# Patient Record
Sex: Male | Born: 1985 | Race: White | Hispanic: No | Smoking: Never smoker
Health system: Southern US, Community
[De-identification: ages and names within clinical notes are randomized; demographics above are authoritative.]

## PROBLEM LIST (undated history)

## (undated) DIAGNOSIS — I10 Essential (primary) hypertension: Secondary | ICD-10-CM

## (undated) DIAGNOSIS — G4733 Obstructive sleep apnea (adult) (pediatric): Secondary | ICD-10-CM

## (undated) DIAGNOSIS — D6801 Von willebrand disease, type 1: Secondary | ICD-10-CM

## (undated) DIAGNOSIS — F419 Anxiety disorder, unspecified: Secondary | ICD-10-CM

## (undated) DIAGNOSIS — R55 Syncope and collapse: Secondary | ICD-10-CM

## (undated) DIAGNOSIS — D680 Von Willebrand's disease: Secondary | ICD-10-CM

## (undated) HISTORY — DX: Essential (primary) hypertension: I10

## (undated) HISTORY — DX: Anxiety disorder, unspecified: F41.9

## (undated) HISTORY — DX: Syncope and collapse: R55

## (undated) HISTORY — DX: Von willebrand disease, type 1: D68.01

## (undated) HISTORY — DX: Obstructive sleep apnea (adult) (pediatric): G47.33

## (undated) HISTORY — DX: Von Willebrand's disease: D68.0

---
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
## 2010-06-19 ENCOUNTER — Inpatient Hospital Stay (HOSPITAL_COMMUNITY)
Admission: EM | Admit: 2010-06-19 | Discharge: 2010-06-20 | DRG: 312 | Disposition: A | Discharge status: Home / Self Care | Payer: Self-pay | Attending: Internal Medicine | Admitting: Internal Medicine

## 2010-06-19 ENCOUNTER — Emergency Department (HOSPITAL_COMMUNITY): Payer: Self-pay

## 2010-06-19 DIAGNOSIS — I10 Essential (primary) hypertension: Secondary | ICD-10-CM | POA: Diagnosis present

## 2010-06-19 DIAGNOSIS — R55 Syncope and collapse: Principal | ICD-10-CM | POA: Diagnosis present

## 2010-06-19 DIAGNOSIS — G4733 Obstructive sleep apnea (adult) (pediatric): Secondary | ICD-10-CM | POA: Diagnosis present

## 2010-06-19 LAB — CBC
Hemoglobin: 17 g/dL (ref 13.0–17.0)
MCH: 32.1 pg (ref 26.0–34.0)
MCHC: 36.9 g/dL — ABNORMAL HIGH (ref 30.0–36.0)
Platelets: 169 10*3/uL (ref 150–400)
RDW: 12 % (ref 11.5–15.5)

## 2010-06-19 LAB — BASIC METABOLIC PANEL
Calcium: 9.9 mg/dL (ref 8.4–10.5)
Creatinine, Ser: 0.97 mg/dL (ref 0.4–1.5)
GFR calc Af Amer: >60 mL/min (ref >60)
GFR calc non Af Amer: >60 mL/min (ref >60)
Sodium: 140 mEq/L (ref 135–145)

## 2010-06-20 ENCOUNTER — Inpatient Hospital Stay (HOSPITAL_COMMUNITY): Payer: Self-pay

## 2010-06-20 ENCOUNTER — Ambulatory Visit (HOSPITAL_COMMUNITY): Payer: 59 | Attending: Internal Medicine

## 2010-06-20 DIAGNOSIS — R55 Syncope and collapse: Secondary | ICD-10-CM

## 2010-06-20 LAB — GLUCOSE, CAPILLARY: Glucose-Capillary: 84 mg/dL (ref 70–99)

## 2010-07-17 ENCOUNTER — Encounter: Payer: 59 | Admitting: Internal Medicine

## 2010-07-25 ENCOUNTER — Encounter (INDEPENDENT_AMBULATORY_CARE_PROVIDER_SITE_OTHER): Payer: Self-pay | Admitting: *Deleted

## 2010-07-25 NOTE — Discharge Summary (Signed)
  NAMESTEVE, Dylan [name]             ACCOUNT NO.:  [id number]  MEDICAL RECORD NO.:  [id number]           PATIENT TYPE:  I  LOCATION:  1407                         FACILITY:  [facility]  PHYSICIAN:  Pricilla Riffle, MD, FACCDATE OF BIRTH:  November 10, 1985  DATE OF ADMISSION:  06/19/2010 DATE OF DISCHARGE:  06/20/2010                              DISCHARGE SUMMARY   PROCEDURES: 1. Graded exercise treadmill. 2. Two-view chest x-ray. 3. 2-D echocardiogram.  PRIMARY FINAL DISCHARGE DIAGNOSIS:  Syncope.  SECONDARY DIAGNOSES: 1. Hypertension. 2. Obstructive sleep apnea on continuous positive airway pressure. 3. History of a mole removal.  TIME AT DISCHARGE:  33 minutes.  HOSPITAL COURSE:  [name] is a 25 year old male with no previous cardiac issues.  He had several episodes of syncope and came to the hospital where he was admitted for further evaluation and treatment.  He had no significant abnormalities on a CBC and a BMET.  He had no significant arrhythmias on telemetry overnight.  The next day he had an exercise treadmill test and during the test reached his target heart rate and exceeded it.  He reached 100% of his predicted maximum without any symptoms.  He had good exercise tolerance, reaching stage 4.  A head CT is pending.  On June 20, 2010, Dr. [name] evaluated his echocardiogram and a head CT is pending.  If these tests are within normal limits, he is considered stable for discharge, to follow up with Neurology and Primary Care as well as Cardiology.  DISCHARGE INSTRUCTIONS:  His activity level is to be increased gradually.  He is encouraged to stick to a heart-healthy diet.  He is to follow up with his primary care physician and with Neurology, which we will arrange.  He will be contacted by Dr. [name] for followup.  DISCHARGE MEDICATIONS:  Atenolol 25 mg daily.     Theodore Demark, PA-C   ______________________________ Pricilla Riffle, MD, [facility]    RB/MEDQ  D:   06/20/2010  T:  06/20/2010  Job:  [id number]  Electronically Signed by Theodore Demark PA-C on 07/04/2010 11:58:55 AM Electronically Signed by Dietrich Pates MD FACC on 07/24/2010 02:11:42 PM

## 2010-07-25 NOTE — Consult Note (Signed)
[name], [name]             ACCOUNT NO.:  [id number]  MEDICAL RECORD NO.:  [id number]           PATIENT TYPE:  I  LOCATION:  1407                         FACILITY:  [facility]  PHYSICIAN:  Pricilla Riffle, MD, FACCDATE OF BIRTH:  [birth year]  DATE OF CONSULTATION:  06/19/2010 DATE OF DISCHARGE:                                CONSULTATION   IDENTIFICATION:  The patient is a 25 year old who was asked to see regarding syncope.  HISTORY OF PRESENT ILLNESS:  The patient has no known cardiac history. He was doing well until about 10 days ago.  Ten days [place], he became dizzy, the room started spinning, he developed a terrible headache and epigastric cramping, diaphoresis, no diarrhea, no vomiting, did not pass out but felt like he was about to, overall the episode lasted about 15 minutes and resolved and he went to bed.  No palpitations.  Three days later, he began passing out. Episodes have always occurred while he was moving "like a ton of bricks the episodes start very fast." He gets dizzy, the room spins, and then he passes out.  When he wakes up, he has a headache, epigastric cramping, he feels a little confused for a short bit and denies any incontinence of bowel or bladder.  No other neurologic symptoms that he has been told.  Episodes last about 5 minutes, and then he is fine though he feels drained the rest of the day.  Again, no prodrome with palpitations.  They have occurred every day x1.  Today he had 2 episodes  A month ago he had no problems.  He denies any prior history of dizziness with standing.  He has not been seen by a physician yet.  Not dizzy at other times.  ALLERGIES:  None.  MEDICATIONS:  Atenolol 50.  PAST MEDICAL HISTORY:  Hypertension, sleep apnea, uses CPAP.  PAST SURGICAL HISTORY:  Mole biopsied.  SOCIAL HISTORY:  The patient is single, works at [employer].  Does not drink, does not smokes.  FAMILY  HISTORY:  Mother is alive, history of hypertension, hyperlipidemia.  Father has a history of obstructive sleep apnea, hypertriglyceridemia and hypertension.  Paternal grandfather with seizures.  Paternal grandmother with CHF, breast cancer, cirrhosis. Paternal uncle died after surgery [facility]).  REVIEW OF SYSTEMS:  All systems reviewed.  Denies fevers, chills, sweats as noted, headache as noted, has had a history of chronic headaches in the past.  No wheezing.  Appetite has been good.  Weight has been stable.  He is really sick.  GU, negative.  GI, negative.  No history of diabetes.  No recent traveling.  Otherwise, all systems reviewed negative to the above problem except as noted above.  PHYSICAL EXAMINATION:  GENERAL:  On exam, the patient is in no distress at rest. VITAL SIGNS:  Blood pressure lying 130/77, pulse 77; sitting 139/91, pulse 81; standing 150/89, pulse 88; standing at 2 minutes 146/87, pulse 89.  Temperature is 98.4, respiratory rate 18. HEENT:  Normocephalic, atraumatic.  EOMI.  PERRL. NECK:  JVP is normal.  No thyromegaly or bruits.  CARDIAC:  Regular rate and rhythm, S1 and S2.  No S3, S4 or murmurs. LUNGS:  Clear to auscultation without rales or wheezes. ABDOMEN:  Supple, nontender.  No masses.  Normal bowel sounds.  No hepatomegaly. EXTREMITIES:  Good distal pulses throughout.  Equal onset.  No lower extremity edema. NEURO:  Alert and oriented x3.  Cranial nerves II-XII grossly intact. Moving all extremities.  Chest x-ray normal.  A 12-lead EKG normal sinus rhythm, 85 beats per minute.  Labs significant for hemoglobin of 17, BUN and creatinine of 12 and 0.97, potassium of 3.9.  IMPRESSION:  The patient is a 25 year old with history of hypertension, sleep apnea and now with 10-day history of recurrent syncope.  All have occurred while he was moving.  He denies dizziness prior.  No palpitations in the prodrome.  He is dizzy when walking, room  starts spinning, he passes out when he wakes, he is confused, has a headache, abdominal cramping and feels weak.  Otherwise denies dizziness.  On exam, he is not orthostatic.  Otherwise exam is unremarkable.  EKG is normal.  PLAN:  Admit to telemetry, hold atenolol and follow blood pressure, echo in the a.m., recheck orthostatics.  Walk with assistance to see if reproduces symptoms.  If negative we will get treadmill test to see if we can reproduce symptoms.  Discussed with Dylan [name].     Pricilla Riffle, MD, [facility]     PVR/MEDQ  D:  06/20/2010  T:  06/20/2010  Job:  [id number]  Electronically Signed by Dietrich Pates MD [facility] on 07/24/2010 02:11:27 PM

## 2010-07-29 NOTE — Letter (Signed)
Summary: Appointment - Missed  [facility], Main Office  [address]   [place], [place] [address]   Phone: [phone]  Fax: [phone]     July 25, 2010 MRN: [id number]   Dylan [name] [address] [place], [place]  [address]   Dear [name],  Our records indicate you missed your appointment on March 1,2012  with  Dr. Gala Romney .  It is very important that we reach you to reschedule this appointment. We look forward to participating in your health care needs. Please contact us at the number listed above at your earliest convenience to reschedule this appointment.     Sincerely,    Glass blower/designer

## 2010-09-02 ENCOUNTER — Encounter: Payer: Self-pay | Admitting: Internal Medicine

## 2011-12-03 ENCOUNTER — Encounter: Payer: Self-pay | Admitting: Internal Medicine

## 2011-12-03 ENCOUNTER — Ambulatory Visit (INDEPENDENT_AMBULATORY_CARE_PROVIDER_SITE_OTHER): Payer: 59 | Admitting: Internal Medicine

## 2011-12-03 VITALS — BP 128/84 | HR 68 | Temp 98.1°F | Wt 185.0 lb

## 2011-12-03 DIAGNOSIS — R55 Syncope and collapse: Secondary | ICD-10-CM | POA: Insufficient documentation

## 2011-12-03 DIAGNOSIS — Z0000 Encounter for general adult medical examination without abnormal findings: Secondary | ICD-10-CM | POA: Insufficient documentation

## 2011-12-03 DIAGNOSIS — Z23 Encounter for immunization: Secondary | ICD-10-CM

## 2011-12-03 DIAGNOSIS — G4733 Obstructive sleep apnea (adult) (pediatric): Secondary | ICD-10-CM

## 2011-12-03 DIAGNOSIS — I10 Essential (primary) hypertension: Secondary | ICD-10-CM | POA: Insufficient documentation

## 2011-12-03 LAB — CBC WITH DIFFERENTIAL/PLATELET
Basophils Absolute: 0 10*3/uL (ref 0.0–0.1)
Basophils Relative: 0.4 % (ref 0.0–3.0)
Eosinophils Absolute: 0 10*3/uL (ref 0.0–0.7)
Eosinophils Relative: 1.3 % (ref 0.0–5.0)
HCT: 43.4 % (ref 39.0–52.0)
Hemoglobin: 14.6 g/dL (ref 13.0–17.0)
Lymphocytes Relative: 23 % (ref 12.0–46.0)
Lymphs Abs: 0.8 10*3/uL (ref 0.7–4.0)
MCHC: 33.8 g/dL (ref 30.0–36.0)
MCV: 92.7 fl (ref 78.0–100.0)
Monocytes Absolute: 0.2 10*3/uL (ref 0.1–1.0)
Monocytes Relative: 6.5 % (ref 3.0–12.0)
Neutro Abs: 2.5 10*3/uL (ref 1.4–7.7)
Neutrophils Relative %: 68.8 % (ref 43.0–77.0)
Platelets: 164 10*3/uL (ref 150.0–400.0)
RBC: 4.68 Mil/uL (ref 4.22–5.81)
RDW: 12.3 % (ref 11.5–14.6)
WBC: 3.7 10*3/uL — ABNORMAL LOW (ref 4.5–10.5)

## 2011-12-03 LAB — LIPID PANEL
HDL: 55.3 mg/dL (ref >39.00)
Total CHOL/HDL Ratio: 3
VLDL: 15.2 mg/dL (ref 0.0–40.0)

## 2011-12-03 LAB — COMPREHENSIVE METABOLIC PANEL
ALT: 15 U/L (ref 0–53)
AST: 16 U/L (ref 0–37)
BUN: 18 mg/dL (ref 6–23)
Calcium: 9.5 mg/dL (ref 8.4–10.5)
Chloride: 103 mEq/L (ref 96–112)
Creatinine, Ser: 0.9 mg/dL (ref 0.4–1.5)
Total Bilirubin: 1 mg/dL (ref 0.3–1.2)

## 2011-12-03 LAB — TSH: TSH: 0.92 u[IU]/mL (ref 0.35–5.50)

## 2011-12-03 NOTE — Assessment & Plan Note (Signed)
Refer to pulmonary, has been untreated x a while

## 2011-12-03 NOTE — Progress Notes (Signed)
  Subjective:    Patient ID: Dylan [name], male    DOB: October 29, 1985, 26 y.o.   MRN: [id number]  HPI New pt, CPX  Past medical history H/o Hypertension, on no meds  Sleep apnea, not using a CPAP at present History of syncope, 2012  Past surgical history skin biopsy 2004  Social history Soon to be married, 1 child  tobacco-- no ETOH-- rarely  Occupation-- Occupational hygienist    Family history Diabetes-- GP HTN-- several family embers CAD-- GP Stroke-- several family members Epilepsy -- GF dementia-- GF Colon cancer-- no Breast cancer-- GM Prostate cancer-- no   Review of Systems Reports recurrent syncopes, symptoms started about late 2011, since then he had about 12 episodes approximately. Was admitted to the hospital   2 -2012, was seen by cardiology, CT head normal, had a echocardiogram (see a/p). He did not followup as an outpatient with cardiology. Episodes are described as sudden collapse, he's not sure if he gets completely unconscious, usually associated with stress, episode last ~ 1 minute. No bladder or bowel incontinence, seizure activity or tongue bite. Denies chest pain, occasionally has a headache. No nausea, vomiting, diarrhea or blood in the stools. No dysuria gross hematuria. No anxiety or depression per se .    Objective:   Physical Exam  General -- alert, well-developed, and well-nourished.   Neck --no thyromegaly , normal carotid pulse Lungs -- normal respiratory effort, no intercostal retractions, no accessory muscle use, and normal breath sounds.   Heart-- normal rate, regular rhythm, no murmur, and no gallop.   Abdomen--soft, non-tender, no distention, no masses, no HSM, no guarding, and no rigidity.   Extremities-- no pretibial edema bilaterally Neurologic-- alert & oriented X3 and strength normal in all extremities. Psych-- Cognition and judgment appear intact. Alert and cooperative with normal attention span and concentration.  not anxious  appearing and not depressed appearing.       Assessment & Plan:

## 2011-12-03 NOTE — Patient Instructions (Addendum)
Check the  blood pressure 2 or 3 times a week, be sure it is between 110/60 and 140/85. If it is consistently higher or lower, let me know  

## 2011-12-03 NOTE — Assessment & Plan Note (Signed)
Tdap today Diet and exercise discussed Labs Self testicular exam

## 2011-12-03 NOTE — Assessment & Plan Note (Addendum)
Recurrent syncope, no associated symptoms, specifically no associated symptoms consistent with seizures. Episodes are triggered by emotional distress. ECHO 06-2010--decreased left ventricular,   diastolic compliance and/or increased left atrial pressure DDX: Arrhythmia,  anxiety,  seizures, others. Plan: EKG today, nsr  Refer to cardiology

## 2011-12-03 NOTE — Assessment & Plan Note (Signed)
At some point he took atenolol, BP corrected after  sleep apnea treatment. Currently on no medications on BP normal despite the fact that he's not using a CPAP

## 2011-12-07 ENCOUNTER — Encounter: Payer: Self-pay | Admitting: *Deleted

## 2011-12-31 ENCOUNTER — Institutional Professional Consult (permissible substitution): Payer: 59 | Admitting: Internal Medicine

## 2012-01-07 ENCOUNTER — Institutional Professional Consult (permissible substitution): Payer: 59 | Admitting: Pulmonary Disease

## 2012-01-28 ENCOUNTER — Institutional Professional Consult (permissible substitution): Payer: 59 | Admitting: Pulmonary Disease

## 2012-01-28 ENCOUNTER — Institutional Professional Consult (permissible substitution): Payer: 59 | Admitting: Cardiology

## 2012-03-10 ENCOUNTER — Telehealth: Payer: Self-pay | Admitting: Internal Medicine

## 2012-03-10 NOTE — Telephone Encounter (Signed)
Patient would like to know if he is due for any immunizations. He has flu shot scheduled for 03/15/12 @ 4:15p. He is expecting a child soon and wants to make sure he does not need anything.

## 2012-03-11 NOTE — Telephone Encounter (Signed)
According to our records the pt is up to date with Tdap. Please advise.

## 2012-03-15 ENCOUNTER — Ambulatory Visit: Payer: 59

## 2012-03-18 ENCOUNTER — Ambulatory Visit (INDEPENDENT_AMBULATORY_CARE_PROVIDER_SITE_OTHER): Payer: 59 | Admitting: Internal Medicine

## 2012-03-18 ENCOUNTER — Encounter: Payer: Self-pay | Admitting: Internal Medicine

## 2012-03-18 VITALS — BP 138/82 | HR 80 | Temp 98.3°F | Wt 190.0 lb

## 2012-03-18 DIAGNOSIS — R238 Other skin changes: Secondary | ICD-10-CM

## 2012-03-18 DIAGNOSIS — J329 Chronic sinusitis, unspecified: Secondary | ICD-10-CM

## 2012-03-18 MED ORDER — AMOXICILLIN 500 MG PO CAPS: 1000.0000 mg | ORAL_CAPSULE | Freq: Two times a day (BID) | ORAL | Status: DC

## 2012-03-18 NOTE — Progress Notes (Signed)
  Subjective:    Patient ID: Dylan [name], male    DOB: 12-27-1985, 26 y.o.   MRN: [id number]  HPI Acute visit, we discussed the following:  History of easy bruising all his life, last week developed 2 bruises-- one on the left shoulder, one in the left leg, they are  better. Denies any injury, not taking aspirin. Also 3 or 4 days history of cough, frontal congestion, frontal headache, occasional green nasal discharge.   Past medical history H/o Hypertension, on no meds   Sleep apnea, not using a CPAP at present History of syncope, 2012  Past surgical history skin biopsy 2004  Social history Soon to be married, 1 child   tobacco-- no ETOH-- rarely   Occupation-- Occupational hygienist    Family history Diabetes-- GP HTN-- several family embers CAD-- GP Stroke-- several family members Epilepsy -- GF dementia-- GF Colon cancer-- no Breast cancer-- GM Prostate cancer-- no  Review of Systems Denies any fever or chills No chest congestion, no history of asthma wheezing. Denies any blood in the stools, blood in the urine , no excessive gum bleeding No history of major surgeries.     Objective:   Physical Exam General -- alert, well-developed, and well-nourished.   HEENT -- TMs bulge B, no red or d/c; throat w/o redness, face symmetric and tender to palpation @ frontal sinuses but not at maxillary area  Lungs -- normal respiratory effort, no intercostal retractions, no accessory muscle use, and normal breath sounds.   Heart-- normal rate, regular rhythm, no murmur, and no gallop.   Extremities-- green colored bruise @ the L deltoid area and L thigh Neurologic-- alert & oriented X3 and strength normal in all extremities. Psych-- Cognition and judgment appear intact. Alert and cooperative with normal attention span and concentration.  not anxious appearing and not depressed appearing.       Assessment & Plan:   Sinusitis, see instruction  Long history of easy bruising,  review of systems is negative for  serious bleeding. Last CBC show normal platelets Plan: Observation, will consider PT, PTT on return to the office  As far as his immunizations, he is up-to-date on the tetanus shot, recommend to get a flu shot once he is better from sinusitis.

## 2012-03-18 NOTE — Telephone Encounter (Signed)
See OV note.  

## 2012-03-18 NOTE — Patient Instructions (Addendum)
Rest, fluids , tylenol For cough, take Mucinex DM twice a day as needed  For congestion use dymista 2 sprays on each side of the nose until sample gone Take the antibiotic as prescribed  (Amoxicillin) Call if no better in few days Call anytime if the symptoms are severe ---- Flu shot once better --- Call if easy bruising gets worse

## 2012-04-07 ENCOUNTER — Telehealth: Payer: Self-pay | Admitting: Internal Medicine

## 2012-04-07 NOTE — Telephone Encounter (Signed)
lmovm for pt to return call.  

## 2012-04-07 NOTE — Telephone Encounter (Signed)
Please advise 

## 2012-04-07 NOTE — Telephone Encounter (Signed)
[facility] Triage Call Report Triage Record Num: [id number] Operator: Jeraldine Loots Patient Name: Dylan [name] Call Date & Time: 04/06/2012 5:14:46PM Patient Phone: [phone] PCP: Nolon Rod. Paz Patient Gender: Male PCP Fax : Patient DOB: 02/16/1986 Practice Name: [facility] Reason for Call: Patient calling, has had blood in his semen for the past 2 nights. Denies any pain or injury; no swelling or tenderness. No fever. Had a recent sinus infection and completed medication for same. Triaged per Scrotum or Testicle issues. He is to call if any sx worsens or he develops any fever or pain. Protocol(s) Used: Scrotum or Testicles Symptoms Recommended Outcome per Protocol: Provide Home/Self Care Reason for Outcome: All other situations Care Advice: ~

## 2012-04-07 NOTE — Telephone Encounter (Signed)
Needs OV within few days

## 2012-04-08 NOTE — Telephone Encounter (Signed)
Discussed with pt's wife. She states she will discuss with the pt & call back to schedule an appt next week.

## 2012-04-11 ENCOUNTER — Encounter: Payer: Self-pay | Admitting: Internal Medicine

## 2012-04-11 ENCOUNTER — Ambulatory Visit (INDEPENDENT_AMBULATORY_CARE_PROVIDER_SITE_OTHER): Payer: 59 | Admitting: Internal Medicine

## 2012-04-11 VITALS — BP 138/86 | HR 69 | Temp 98.2°F | Wt 192.0 lb

## 2012-04-11 DIAGNOSIS — R361 Hematospermia: Secondary | ICD-10-CM | POA: Insufficient documentation

## 2012-04-11 LAB — POCT URINALYSIS DIPSTICK: Protein, UA: NEGATIVE

## 2012-04-11 NOTE — Assessment & Plan Note (Signed)
History recent hematospermia with a normal exam. Similar episode last year. Plan: UA, urine culture, G&C. If negative will recommend observation. If you continue having episodes of hematospermia, will refer to urology

## 2012-04-11 NOTE — Progress Notes (Signed)
  Subjective:    Patient ID: Dylan [name], male    DOB: [birth year], 26 y.o.   MRN: [id number]  HPI  Acute visit, here with his wife Last week, in 2 consecutive days, saw blood in the semen, described as pink in color. No further episodes since. He recalls that something similar happen last year.  Past medical history H/o Hypertension, on no meds   Sleep apnea, not using a CPAP at present History of syncope, 2012  Past surgical history skin biopsy 2004  Social history Soon to be married, 1 child   tobacco-- no ETOH-- rarely   Occupation-- Occupational hygienist    Review of Systems Denies any pain with orgasm. No fever or chills No difficulty urinating, dysuria, gross hematuria.    Objective:   Physical Exam General -- alert, well-developed, and well-nourished.    Rectal-- No external abnormalities noted. Normal sphincter tone. No rectal masses or tenderness. Brown stool  Prostate:  Prostate gland firm and smooth, no enlargement, nodularity, tenderness, mass, asymmetry or induration. No inguinal adenopathy or hernia Neurologic-- alert & oriented X3 and strength normal in all extremities. Psych-- Cognition and judgment appear intact. Alert and cooperative with normal attention span and concentration.  not anxious appearing and not depressed appearing.        Assessment & Plan:

## 2012-04-11 NOTE — Patient Instructions (Addendum)
Please call if this happens again, you will be referred to urology

## 2012-04-12 LAB — URINALYSIS, MICROSCOPIC ONLY: Squamous Epithelial / LPF: NONE SEEN

## 2012-04-12 LAB — URINE CULTURE
Colony Count: NO GROWTH
Organism ID, Bacteria: NO GROWTH

## 2012-04-12 LAB — GC/CHLAMYDIA PROBE AMP, URINE
Chlamydia, Swab/Urine, PCR: NEGATIVE
GC Probe Amp, Urine: NEGATIVE

## 2012-04-13 ENCOUNTER — Encounter: Payer: Self-pay | Admitting: *Deleted

## 2012-04-19 ENCOUNTER — Telehealth: Payer: Self-pay | Admitting: *Deleted

## 2012-04-19 DIAGNOSIS — R361 Hematospermia: Secondary | ICD-10-CM

## 2012-04-19 NOTE — Telephone Encounter (Signed)
Pt's fiance called stating that the pt is continuing to have blood in his semen & would like to know what he needs to do. Please advise.

## 2012-04-19 NOTE — Telephone Encounter (Signed)
Arrange a urology referral -- dx  hematospermia Please call the patient and let him know

## 2012-04-20 NOTE — Telephone Encounter (Signed)
Left detailed msg on vmail. Referral entered.

## 2012-06-09 ENCOUNTER — Ambulatory Visit: Payer: 59 | Admitting: Internal Medicine

## 2012-06-10 ENCOUNTER — Encounter: Payer: Self-pay | Admitting: Internal Medicine

## 2012-06-10 ENCOUNTER — Ambulatory Visit (INDEPENDENT_AMBULATORY_CARE_PROVIDER_SITE_OTHER): Payer: 59 | Admitting: Internal Medicine

## 2012-06-10 VITALS — BP 128/84 | HR 74 | Temp 98.0°F | Wt 194.0 lb

## 2012-06-10 DIAGNOSIS — F411 Generalized anxiety disorder: Secondary | ICD-10-CM

## 2012-06-10 DIAGNOSIS — R361 Hematospermia: Secondary | ICD-10-CM

## 2012-06-10 DIAGNOSIS — D680 Von Willebrand's disease: Secondary | ICD-10-CM | POA: Insufficient documentation

## 2012-06-10 DIAGNOSIS — R55 Syncope and collapse: Secondary | ICD-10-CM

## 2012-06-10 DIAGNOSIS — R791 Abnormal coagulation profile: Secondary | ICD-10-CM

## 2012-06-10 DIAGNOSIS — G4733 Obstructive sleep apnea (adult) (pediatric): Secondary | ICD-10-CM

## 2012-06-10 DIAGNOSIS — R238 Other skin changes: Secondary | ICD-10-CM

## 2012-06-10 DIAGNOSIS — F419 Anxiety disorder, unspecified: Secondary | ICD-10-CM

## 2012-06-10 LAB — PROTIME-INR
INR: 1.2 ratio — ABNORMAL HIGH (ref 0.8–1.0)
Prothrombin Time: 12.4 s (ref 10.2–12.4)

## 2012-06-10 MED ORDER — ESCITALOPRAM OXALATE 10 MG PO TABS: 10.0000 mg | ORAL_TABLET | Freq: Every day | ORAL | Status: DC

## 2012-06-10 NOTE — Assessment & Plan Note (Signed)
Saw urology 05/27/2012, no need for further workup of this point.

## 2012-06-10 NOTE — Patient Instructions (Addendum)
Come back in 6 weeks, call if you have  side effects from escitalopram Is important that you see the heart doctor and pulmonologist for the treatment of sleep apnea. If you need help scheduling those appointments let us know

## 2012-06-10 NOTE — Assessment & Plan Note (Addendum)
Ongoing symptoms, has put off seeing a cardiologist due to a lack of time. I explained the patient is extremely important that he gets evaluated, risk of accidents, hurting himself or others discussed. He has noted that syncope is consistently related to times of high stress, we are starting treatment with Lexapro today.

## 2012-06-10 NOTE — Assessment & Plan Note (Addendum)
Pt has mentioned this issue before, has hematospermia about denies major bleeding such as blood in the stool or in the urine. We'll go ahead and check a PT and PTT, if  labs normal, recommend observation . Recent  CBC normal

## 2012-06-10 NOTE — Assessment & Plan Note (Addendum)
The patient does have some anxiety, mostly related to work. I think he needs treatment, we talked about counseling and medication. He has never tried SSRIs. I think he is a good candidate for lexapro. Plan: Trial with Lexapro, recheck in 6 weeks, side effects discussed, to call if develops suicidal ideas.

## 2012-06-10 NOTE — Progress Notes (Signed)
  Subjective:    Patient ID: Dylan [name], male    DOB: 12-07-1985, 27 y.o.   MRN: [id number]  HPI Six-month followup, we discussed the following issues  Continue with easy bruising. Denies blood in the stools or in the urine, no excessive bleeding when he brushes his teeth. He did have some hematospermia, saw urology, was recommended no further workup.  Syncope, had 3 or 4 additional episodes, the only thing that he can tell is that is usually related to times of high stress. Has not seen cardiology; reports that he is very busy and didn't have time.  Sleep apnea, still has not find time to see the pulmonary doctor.  On further questioning, I asked him about anxiety, he admits that he has a lot of stress, mostly job-related but also has a 40.27 month old baby at home. He admits to feeling anxious most days, denies depression.   Past Medical History  Diagnosis Date  . Hypertension   . OSA (obstructive sleep apnea)   . Anxiety   . Syncope    Past Surgical History  Procedure Date  . Skin biopsy 2004    Social history  Soon to be married, 1 child  tobacco-- no  ETOH-- rarely  Occupation-- Occupational hygienist    Review of Systems See HPI    Objective:   Physical Exam General -- alert, well-developed, and well-nourished.   Skin -- single small (2cm)  bruise noted in the right chest, greenish in color Neurologic-- alert & oriented X3 and strength normal in all extremities. Psych-- Cognition and judgment appear intact. Alert and cooperative with normal attention span and concentration.  Mild-mod anxious appearing ; not depressed appearing.       Assessment & Plan:  Today , I spent more than 25 min with the patient, >50% of the time counseling

## 2012-06-10 NOTE — Assessment & Plan Note (Signed)
Has not seen pulmonary, I again emphasized the need to treat sleep apnea!

## 2012-06-13 ENCOUNTER — Encounter: Payer: Self-pay | Admitting: *Deleted

## 2012-06-13 NOTE — Addendum Note (Signed)
Addended by: Edwena Felty T on: 06/13/2012 05:10 PM   Modules accepted: Orders

## 2012-06-15 ENCOUNTER — Telehealth: Payer: Self-pay | Admitting: Internal Medicine

## 2012-06-15 ENCOUNTER — Telehealth: Payer: Self-pay

## 2012-06-15 ENCOUNTER — Telehealth: Payer: Self-pay | Admitting: Hematology & Oncology

## 2012-06-15 NOTE — Telephone Encounter (Signed)
PT MADE 2-7 APPOINTMENT

## 2012-06-15 NOTE — Telephone Encounter (Signed)
Spoke w/pt fiance. Updated ph #.

## 2012-06-15 NOTE — Telephone Encounter (Signed)
Spoke with pt's fiance. Appt scheduled with Dr. Myna Hidalgo for 2.7.14.

## 2012-06-15 NOTE — Telephone Encounter (Signed)
Caller from Dr.Ennever's office called to give a status update on referral, they have tried to contact patient and number on file is disconnected. Left message on VM for patient's emergency contact (Fiance-see demographics) (FYI)

## 2012-06-15 NOTE — Telephone Encounter (Signed)
Patient's fiance, Baxter Hire, calling to go over patient's recent lab results.  She and the patient want more clarification about what these labs were, and why they may have been so abnormal.

## 2012-06-15 NOTE — Telephone Encounter (Signed)
Discussed with pt's fiance.

## 2012-06-22 ENCOUNTER — Emergency Department (HOSPITAL_BASED_OUTPATIENT_CLINIC_OR_DEPARTMENT_OTHER)
Admission: EM | Admit: 2012-06-22 | Discharge: 2012-06-22 | Disposition: A | Discharge status: Home / Self Care | Payer: 59 | Attending: Emergency Medicine | Admitting: Emergency Medicine

## 2012-06-22 ENCOUNTER — Encounter (HOSPITAL_BASED_OUTPATIENT_CLINIC_OR_DEPARTMENT_OTHER): Payer: Self-pay | Admitting: *Deleted

## 2012-06-22 DIAGNOSIS — I10 Essential (primary) hypertension: Secondary | ICD-10-CM | POA: Insufficient documentation

## 2012-06-22 DIAGNOSIS — IMO0002 Reserved for concepts with insufficient information to code with codable children: Secondary | ICD-10-CM | POA: Insufficient documentation

## 2012-06-22 DIAGNOSIS — G4733 Obstructive sleep apnea (adult) (pediatric): Secondary | ICD-10-CM | POA: Insufficient documentation

## 2012-06-22 DIAGNOSIS — Z79899 Other long term (current) drug therapy: Secondary | ICD-10-CM | POA: Insufficient documentation

## 2012-06-22 DIAGNOSIS — F411 Generalized anxiety disorder: Secondary | ICD-10-CM | POA: Insufficient documentation

## 2012-06-22 DIAGNOSIS — Y9289 Other specified places as the place of occurrence of the external cause: Secondary | ICD-10-CM | POA: Insufficient documentation

## 2012-06-22 DIAGNOSIS — Y9389 Activity, other specified: Secondary | ICD-10-CM | POA: Insufficient documentation

## 2012-06-22 DIAGNOSIS — T148XXA Other injury of unspecified body region, initial encounter: Secondary | ICD-10-CM

## 2012-06-22 MED ORDER — METHOCARBAMOL 500 MG PO TABS: 500.0000 mg | ORAL_TABLET | Freq: Two times a day (BID) | ORAL | Status: DC

## 2012-06-22 MED ORDER — IBUPROFEN 600 MG PO TABS: 600.0000 mg | ORAL_TABLET | Freq: Four times a day (QID) | ORAL | Status: DC | PRN
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
## 2012-06-24 ENCOUNTER — Ambulatory Visit: Payer: 59

## 2012-06-24 ENCOUNTER — Ambulatory Visit (HOSPITAL_BASED_OUTPATIENT_CLINIC_OR_DEPARTMENT_OTHER)
Admission: RE | Admit: 2012-06-24 | Discharge: 2012-06-24 | Disposition: A | Discharge status: Home / Self Care | Payer: 59 | Source: Ambulatory Visit | Attending: Hematology & Oncology | Admitting: Hematology & Oncology

## 2012-06-24 ENCOUNTER — Other Ambulatory Visit: Payer: Self-pay | Admitting: Oncology

## 2012-06-24 ENCOUNTER — Ambulatory Visit (HOSPITAL_BASED_OUTPATIENT_CLINIC_OR_DEPARTMENT_OTHER): Payer: 59 | Admitting: Hematology & Oncology

## 2012-06-24 ENCOUNTER — Other Ambulatory Visit (HOSPITAL_BASED_OUTPATIENT_CLINIC_OR_DEPARTMENT_OTHER): Payer: 59 | Admitting: Lab

## 2012-06-24 VITALS — BP 133/81 | HR 98 | Temp 98.1°F | Resp 18 | Ht 74.0 in | Wt 194.0 lb

## 2012-06-24 DIAGNOSIS — R042 Hemoptysis: Secondary | ICD-10-CM

## 2012-06-24 DIAGNOSIS — D510 Vitamin B12 deficiency anemia due to intrinsic factor deficiency: Secondary | ICD-10-CM

## 2012-06-24 DIAGNOSIS — T148XXA Other injury of unspecified body region, initial encounter: Secondary | ICD-10-CM

## 2012-06-24 DIAGNOSIS — R42 Dizziness and giddiness: Secondary | ICD-10-CM | POA: Insufficient documentation

## 2012-06-24 DIAGNOSIS — R791 Abnormal coagulation profile: Secondary | ICD-10-CM

## 2012-06-24 DIAGNOSIS — R55 Syncope and collapse: Secondary | ICD-10-CM | POA: Insufficient documentation

## 2012-06-24 LAB — CBC WITH DIFFERENTIAL (CANCER CENTER ONLY)
BASO#: 0 10*3/uL (ref 0.0–0.2)
EOS%: 1.5 % (ref 0.0–7.0)
Eosinophils Absolute: 0.1 10*3/uL (ref 0.0–0.5)
HGB: 15.8 g/dL (ref 13.0–17.1)
LYMPH#: 1.2 10*3/uL (ref 0.9–3.3)
NEUT#: 2.4 10*3/uL (ref 1.5–6.5)
Platelets: 185 10*3/uL (ref 145–400)
RBC: 5.04 10*6/uL (ref 4.20–5.70)

## 2012-06-24 LAB — CHCC SATELLITE - SMEAR

## 2012-06-24 NOTE — Progress Notes (Signed)
This office note has been dictated.

## 2012-06-25 NOTE — Progress Notes (Signed)
CC:   Dylan Ora, MD  DIAGNOSES: 1. Easy bruising. 2. Elevated partial thromboplastin time.  HISTORY OF PRESENT ILLNESS:  Dylan [name] is a real nice 27 year old white gentleman.  He works down at [employer] at, I think, [employer] [place].  He comes in with his wife and his new baby who is about 85 or 71 months old.  He sees Dr. Willow [name].  He had been complaining of some problems with bruising.  He has had no bleeding.  He has no nausea or vomiting.  He does have some issues with his sperm, but he sees Dr. Isabel Caprice of Urology for this.  He did have an episode of hemoptysis I think a couple weeks or so ago. This was just 1 time.  He does not smoke.  He also has had some syncopal episodes.  It is hard to say what triggers these.  He has not had any x-rays or cardiac issue studies for about 2 years so.  He also has sleep apnea.  He is not a big guy.  He does have the apparatus set up which he does not like to use.  Again, we are asked to see him because of this issue with some bruising.  He had a PTT done by Dr. Drue Novel on January 24th.  His PTT was 39.2 seconds.  He had a prothrombin time which was normal at 12.4 seconds with an INR of 1.2.  Again, Dylan [name] is feeling okay.  Again, he has not had any obvious bleeding.  He has not had any blood with his urine.  There has been no bleeding with the bowels.  He has not had any type of tooth extractions.  He has had no surgeries. He did have a mole removed from his left anterior chest wall.  There was no bleeding associated with this.  Again, we are asked to see him because of this bruising and elevated PTT.  PAST MEDICAL HISTORY: 1. Hypertension. 2. Obstructive sleep apnea. 3. Anxiety. 4. Syncope. 5. Hematospermia.  ALLERGIES:  None.  MEDICATIONS:  Ibuprofen 600 mg p.o. q.6 hours p.r.n.  SOCIAL HISTORY:  Negative for tobacco use.  He has rare alcohol use.  He has 2 children.  FAMILY HISTORY:  Negative for any bleeding or  bruising.  REVIEW OF SYSTEMS:  As stated in the history of present illness.  No additional findings noted on a 12-system review.  Again, he has had these syncopal episodes.  He did have the bout of hemoptysis.  PHYSICAL EXAMINATION:  General:  This is a well-developed, well- nourished white gentleman in no obvious distress.  Vital signs: Temperature of 98.1, pulse 68, respiratory rate 18, blood pressure 133/81.  Weight is 194.  Head and neck:  Normocephalic, atraumatic skull.  There are no ocular or oral lesions.  There are no palpable cervical or supraclavicular lymph nodes.  Lungs:  Clear to percussion and auscultation bilaterally.  There are no rales, wheezes, or rhonchi. Cardiac:  Regular rate and rhythm with a normal S1 and S2.  There are no murmurs, rubs. Or bruits.  Abdomen:  Soft with good bowel sounds.  There is no palpable abdominal mass.  There is no fluid wave.  There is no palpable hepatosplenomegaly.  Back:  No tenderness over the spine, ribs, or hips.  Extremities:  No clubbing, cyanosis, or edema.  He has good range of motion of his joints.  Skin:  No ecchymoses.  Neurological:  No focal neurological deficits.  LABORATORY STUDIES:  White cell count 4, hemoglobin 15.8, hematocrit 43, platelet count 185.  Chest x-ray shows normal cardiac silhouette.  There are no pulmonary parenchymal nodules.  IMPRESSION:  Dylan [name] is a 27 year old gentleman with an with elevated PTT.  The PTT is minimally elevated.  He has some excessive bruising.  I just cannot find anything to-date that would suggest an underlying coagulopathy.  I would be shocked if he has anything that would lead to increased bruising.  We are checking him for von Willebrand disease.  Given that he has an elevated PTT, I will send off a lupus anticoagulant panel on him and also anticardiolipin antibodies.  I just cannot imagine that Dylan [name] would have anything going on hematologically.  He does  take ibuprofen.  This could be the source for the increased bruising.  I am just going to follow Dylan [name] right now.  I will plan to get him back in another few months.  I will then see how things are going for him.    ______________________________ Josph Macho, M.D. PRE/MEDQ  D:  06/24/2012  T:  06/25/2012  Job:  [id number]

## 2012-06-28 LAB — LUPUS ANTICOAGULANT PANEL
DRVVT 1:1 Mix: 38.7 secs (ref <42.9)
Lupus Anticoagulant: NOT DETECTED

## 2012-06-30 LAB — PROTHROMBIN TIME: Prothrombin Time: 14 seconds (ref 11.6–15.2)

## 2012-06-30 LAB — APTT: aPTT: 41 seconds — ABNORMAL HIGH (ref 24–37)

## 2012-06-30 LAB — VON WILLEBRAND PANEL
Coagulation Factor VIII: 59 % — ABNORMAL LOW (ref 73–140)
Ristocetin Co-factor, Plasma: 36 % — ABNORMAL LOW (ref 42–200)

## 2012-07-01 ENCOUNTER — Institutional Professional Consult (permissible substitution): Payer: 59 | Admitting: Cardiovascular Disease

## 2012-07-06 ENCOUNTER — Telehealth: Payer: Self-pay | Admitting: Hematology & Oncology

## 2012-07-06 NOTE — Telephone Encounter (Signed)
Pt called said we had billed the wrong insurance for his visit. I told him he would need to call the number on the bill or I could see if Wille Glaser could explain it to him I offered to take a message and have Wille Glaser to call him in the morning. He said he would call back. He also wanted to speak to the MD about test results, I transferred call to Amy RN

## 2012-07-06 NOTE — Telephone Encounter (Signed)
I called and spoke with Dylan [name] and told him that I thought that he had Type 1 von Willebrand disease.  There is nothing to do for this except if he needs surgery. I told him NOT to take ASA and to limit taking Motrin/Alleve.  We do not need to see him again unless he needs surgery or begins to have bleeding issues.  Vit B12 is ok.  Pete E.

## 2012-07-22 ENCOUNTER — Ambulatory Visit: Payer: 59 | Admitting: Internal Medicine

## 2012-07-26 ENCOUNTER — Encounter: Payer: Self-pay | Admitting: Cardiovascular Disease

## 2012-08-11 ENCOUNTER — Telehealth: Payer: Self-pay | Admitting: Internal Medicine

## 2012-08-11 NOTE — Telephone Encounter (Signed)
Please advise 

## 2012-08-11 NOTE — Telephone Encounter (Signed)
Patient called to check status of wellness form. He must have this submitted by 08/15/12.

## 2012-08-12 NOTE — Telephone Encounter (Signed)
Pt made aware form is at front desk ready to be picked up.

## 2012-08-12 NOTE — Telephone Encounter (Signed)
done

## 2012-09-13 ENCOUNTER — Encounter (HOSPITAL_BASED_OUTPATIENT_CLINIC_OR_DEPARTMENT_OTHER): Payer: Self-pay | Admitting: *Deleted

## 2012-09-13 ENCOUNTER — Emergency Department (HOSPITAL_BASED_OUTPATIENT_CLINIC_OR_DEPARTMENT_OTHER): Payer: No Typology Code available for payment source

## 2012-09-13 ENCOUNTER — Emergency Department (HOSPITAL_BASED_OUTPATIENT_CLINIC_OR_DEPARTMENT_OTHER)
Admission: EM | Admit: 2012-09-13 | Discharge: 2012-09-13 | Disposition: A | Discharge status: Home / Self Care | Payer: No Typology Code available for payment source | Attending: Emergency Medicine | Admitting: Emergency Medicine

## 2012-09-13 DIAGNOSIS — Y9389 Activity, other specified: Secondary | ICD-10-CM | POA: Insufficient documentation

## 2012-09-13 DIAGNOSIS — Z8669 Personal history of other diseases of the nervous system and sense organs: Secondary | ICD-10-CM | POA: Insufficient documentation

## 2012-09-13 DIAGNOSIS — I10 Essential (primary) hypertension: Secondary | ICD-10-CM | POA: Insufficient documentation

## 2012-09-13 DIAGNOSIS — Z8659 Personal history of other mental and behavioral disorders: Secondary | ICD-10-CM | POA: Insufficient documentation

## 2012-09-13 DIAGNOSIS — Y92410 Unspecified street and highway as the place of occurrence of the external cause: Secondary | ICD-10-CM | POA: Insufficient documentation

## 2012-09-13 DIAGNOSIS — M545 Low back pain: Secondary | ICD-10-CM

## 2012-09-13 DIAGNOSIS — IMO0002 Reserved for concepts with insufficient information to code with codable children: Secondary | ICD-10-CM | POA: Insufficient documentation

## 2012-09-13 MED ORDER — CYCLOBENZAPRINE HCL 10 MG PO TABS: 10.0000 mg | ORAL_TABLET | Freq: Two times a day (BID) | ORAL | Status: DC | PRN
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
## 2012-09-13 NOTE — ED Notes (Signed)
rx x 1 given for flexeril- d/c home with family

## 2012-09-13 NOTE — ED Provider Notes (Signed)
Medical screening examination/treatment/procedure(s) were performed by non-physician practitioner and as supervising physician I was immediately available for consultation/collaboration.   Joya Gaskins, MD 09/13/12 2126

## 2012-09-14 ENCOUNTER — Encounter: Payer: Self-pay | Admitting: *Deleted

## 2012-09-14 ENCOUNTER — Encounter: Payer: Self-pay | Admitting: Cardiology

## 2012-09-15 ENCOUNTER — Ambulatory Visit: Payer: 59 | Admitting: Cardiovascular Disease

## 2012-10-13 ENCOUNTER — Ambulatory Visit: Payer: 59 | Admitting: Cardiovascular Disease

## 2013-04-20 ENCOUNTER — Telehealth: Payer: Self-pay | Admitting: *Deleted

## 2013-04-20 NOTE — Telephone Encounter (Signed)
Left message on voicemail to return my call.  

## 2013-04-21 ENCOUNTER — Encounter: Payer: Self-pay | Admitting: Internal Medicine

## 2013-04-21 ENCOUNTER — Ambulatory Visit (INDEPENDENT_AMBULATORY_CARE_PROVIDER_SITE_OTHER): Payer: 59 | Admitting: Internal Medicine

## 2013-04-21 VITALS — BP 111/72 | HR 71 | Temp 97.6°F | Ht 73.4 in | Wt 183.0 lb

## 2013-04-21 DIAGNOSIS — Z0000 Encounter for general adult medical examination without abnormal findings: Secondary | ICD-10-CM

## 2013-04-21 DIAGNOSIS — I10 Essential (primary) hypertension: Secondary | ICD-10-CM

## 2013-04-21 DIAGNOSIS — Z23 Encounter for immunization: Secondary | ICD-10-CM

## 2013-04-21 DIAGNOSIS — G4733 Obstructive sleep apnea (adult) (pediatric): Secondary | ICD-10-CM

## 2013-04-21 DIAGNOSIS — F419 Anxiety disorder, unspecified: Secondary | ICD-10-CM

## 2013-04-21 DIAGNOSIS — R55 Syncope and collapse: Secondary | ICD-10-CM

## 2013-04-21 LAB — COMPREHENSIVE METABOLIC PANEL
Albumin: 4.6 g/dL (ref 3.5–5.2)
Alkaline Phosphatase: 80 U/L (ref 39–117)
BUN: 18 mg/dL (ref 6–23)
CO2: 28 mEq/L (ref 19–32)
Calcium: 9.7 mg/dL (ref 8.4–10.5)
Chloride: 101 mEq/L (ref 96–112)
Glucose, Bld: 83 mg/dL (ref 70–99)
Potassium: 3.8 mEq/L (ref 3.5–5.1)
Sodium: 138 mEq/L (ref 135–145)
Total Protein: 7.8 g/dL (ref 6.0–8.3)

## 2013-04-21 LAB — CBC WITH DIFFERENTIAL/PLATELET
Basophils Relative: 0.5 % (ref 0.0–3.0)
Eosinophils Relative: 0.5 % (ref 0.0–5.0)
Lymphocytes Relative: 17.2 % (ref 12.0–46.0)
Monocytes Absolute: 0.4 10*3/uL (ref 0.1–1.0)
Monocytes Relative: 7.3 % (ref 3.0–12.0)
Neutrophils Relative %: 74.5 % (ref 43.0–77.0)
Platelets: 218 10*3/uL (ref 150.0–400.0)
RBC: 4.77 Mil/uL (ref 4.22–5.81)
WBC: 4.9 10*3/uL (ref 4.5–10.5)

## 2013-04-21 LAB — TSH: TSH: 1 u[IU]/mL (ref 0.35–5.50)

## 2013-04-21 NOTE — Assessment & Plan Note (Signed)
Uses a Cpap some nights, did not see pulmonary

## 2013-04-21 NOTE — Progress Notes (Signed)
   Subjective:    Patient ID: Dylan [name], male    DOB: 1986/05/06, 27 y.o.   MRN: [id number]  HPI CPX   Past Medical History  Diagnosis Date  . Hypertension   . OSA (obstructive sleep apnea)   . Anxiety   . Syncope   . Type 1 von Willebrand disease    Past Surgical History  Procedure Laterality Date  . Skin biopsy  2004   History   Social History  . Marital Status: Single    Spouse Name: N/A    Number of Children: 2  . Years of Education: N/A   Occupational History  . Occupational hygienist    Social History Main Topics  . Smoking status: Never Smoker   . Smokeless tobacco: Never Used  . Alcohol Use: Yes     Comment: rarely   . Drug Use: No  . Sexual Activity: No   Other Topics Concern  . Not on file   Social History Narrative   Married , 2 kids    Family History  Problem Relation Age of Onset  . Colon cancer Neg Hx   . Prostate cancer Neg Hx   . Diabetes Other     GM, dx early   . CAD Other     uncle at age 85  . Stroke Mother     M, GF, uncle      Review of Systems Diet-- healthy Exercise-- active at work No  CP, SOB, lower extremity edema Denies  nausea, vomiting diarrhea Denies  blood in the stools (-) cough, sputum production, (-) wheezing, chest congestion (except for a URI ~ 2 weeks ago) No dysuria, gross hematuria, difficulty urinating   No anxiety, depression     Objective:   Physical Exam BP 111/72  Pulse 71  Temp(Src) 97.6 F (36.4 C)  Ht 6' 1.4" (1.864 m)  Wt 183 lb (83.008 kg)  BMI 23.89 kg/m2  SpO2 100%  General -- alert, well-developed, NAD.  Neck --no thyromegaly Lungs -- normal respiratory effort, no intercostal retractions, no accessory muscle use, and normal breath sounds.  Heart-- normal rate, regular rhythm, no murmur.  Abdomen-- Not distended, good bowel sounds,soft, non-tender. Extremities-- no pretibial edema bilaterally  Neurologic--  alert & oriented X3. Speech normal, gait normal, strength normal in all  extremities.  Psych-- Cognition and judgment appear intact. Cooperative with normal attention span and concentration. No anxious appearing , no depressed appearing.      Assessment & Plan:

## 2013-04-21 NOTE — Patient Instructions (Signed)
Get your blood work before you leave  Next visit for a physical exam , fasting, in 1 year Please make an appointment    Testicular Self-Exam A self-examination of your testicles involves looking at and feeling your testicles for abnormal lumps or swelling. Several things can cause swelling, lumps, or pain in your testicles. Some of these causes are:  Injuries.  Inflammation.  Infection.  Accumulation of fluids around your testicle (hydrocele).  Twisted testicles (testicular torsion).  Testicular cancer. Self-examination of the testicles and groin areas may be advised if you are at risk for testicular cancer. Risks for testicular cancer include:  An undescended testicle (cryptorchidism).  A history of previous testicular cancer.  A family history of testicular cancer. The testicles are easiest to examine after warm baths or showers and are more difficult to examine when you are cold. This is because the muscles attached to the testicles retract and pull them up higher or into the abdomen. Follow these steps while you are standing:  Hold your penis away from your body.  Roll one testicle between your thumb and forefinger, feeling the entire testicle.  Roll the other testicle between your thumb and forefinger, feeling the entire testicle. Feel for lumps, swelling, or discomfort. A normal testicle is egg shaped and feels firm. It is smooth and not tender. The spermatic cord can be felt as a firm spaghetti-like cord at the back of your testicle. It is also important to examine the crease between the front of your leg and your abdomen. Feel for any bumps that are tender. These could be enlarged lymph nodes.  Document Released: 08/10/2000 Document Revised: 01/04/2013 Document Reviewed: 10/24/2012 [facility] Patient Information 2014 [place], [place].

## 2013-04-21 NOTE — Assessment & Plan Note (Signed)
Still has occ syncope, thinks related to anxiety , did not see the specialist "I know I need to"

## 2013-04-21 NOTE — Assessment & Plan Note (Signed)
Normal BPs

## 2013-04-21 NOTE — Assessment & Plan Note (Signed)
See last entry , used lexapro "x a week or so", self d/c, "didin't help" Currently asx

## 2013-04-21 NOTE — Assessment & Plan Note (Signed)
Tdap 2013 Had a flu shot Never had a cscope Diet and exercise discussed Labs Self testicular exam

## 2013-04-21 NOTE — Progress Notes (Signed)
Pre visit review using our clinic review tool, if applicable. No additional management support is needed unless otherwise documented below in the visit note. 

## 2013-04-22 ENCOUNTER — Encounter: Payer: Self-pay | Admitting: Internal Medicine

## 2013-04-27 ENCOUNTER — Encounter: Payer: Self-pay | Admitting: *Deleted

## 2013-07-03 IMAGING — CR DG CHEST 2V
2 series · 2 of 2 positions shown · non-contrast
Comparison: 06/19/2010

CLINICAL DATA: Episode of hemoptysis.  No history smoking.
Syncope.  Dizziness.

CHEST - 2 VIEW

[w chest pa]
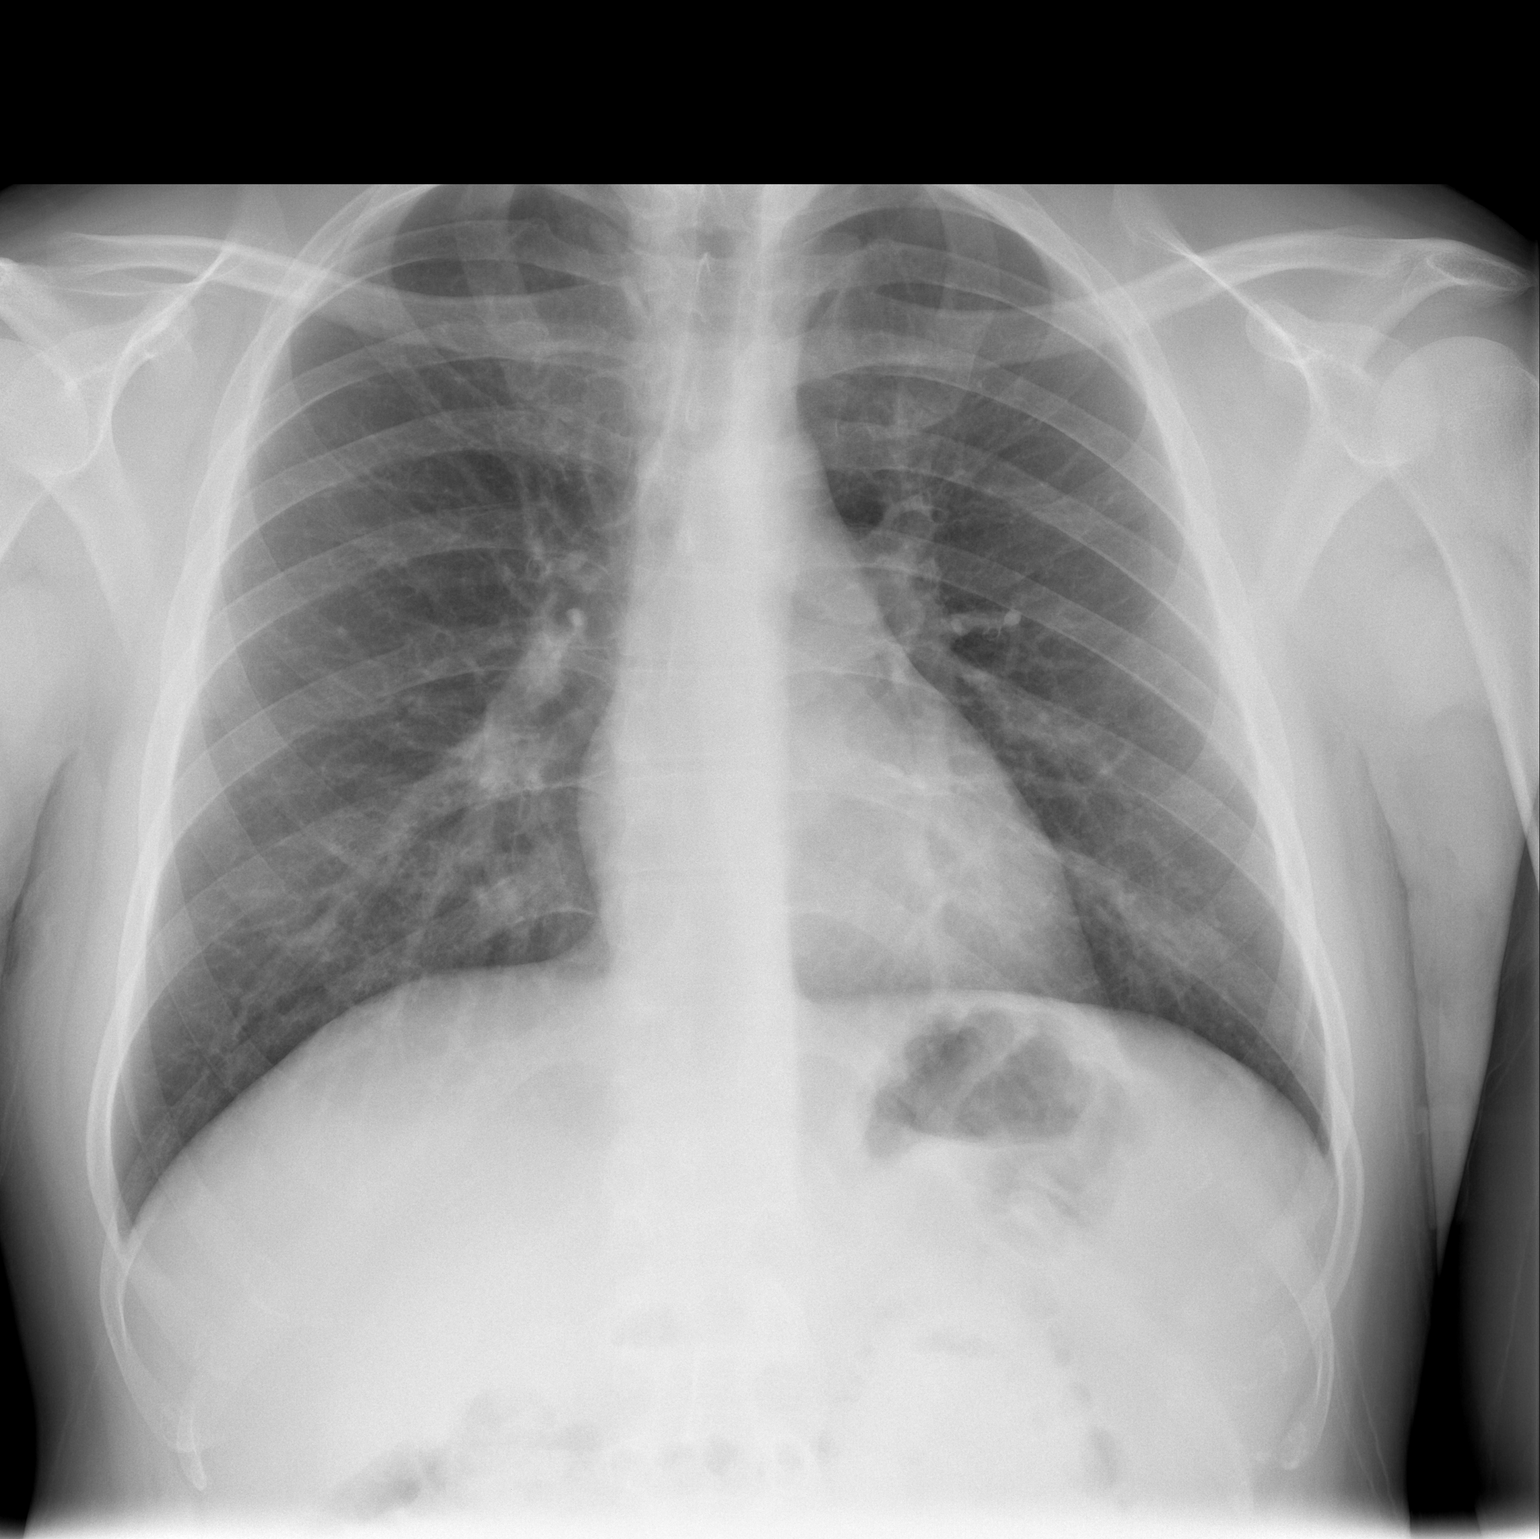

[w chest lat]
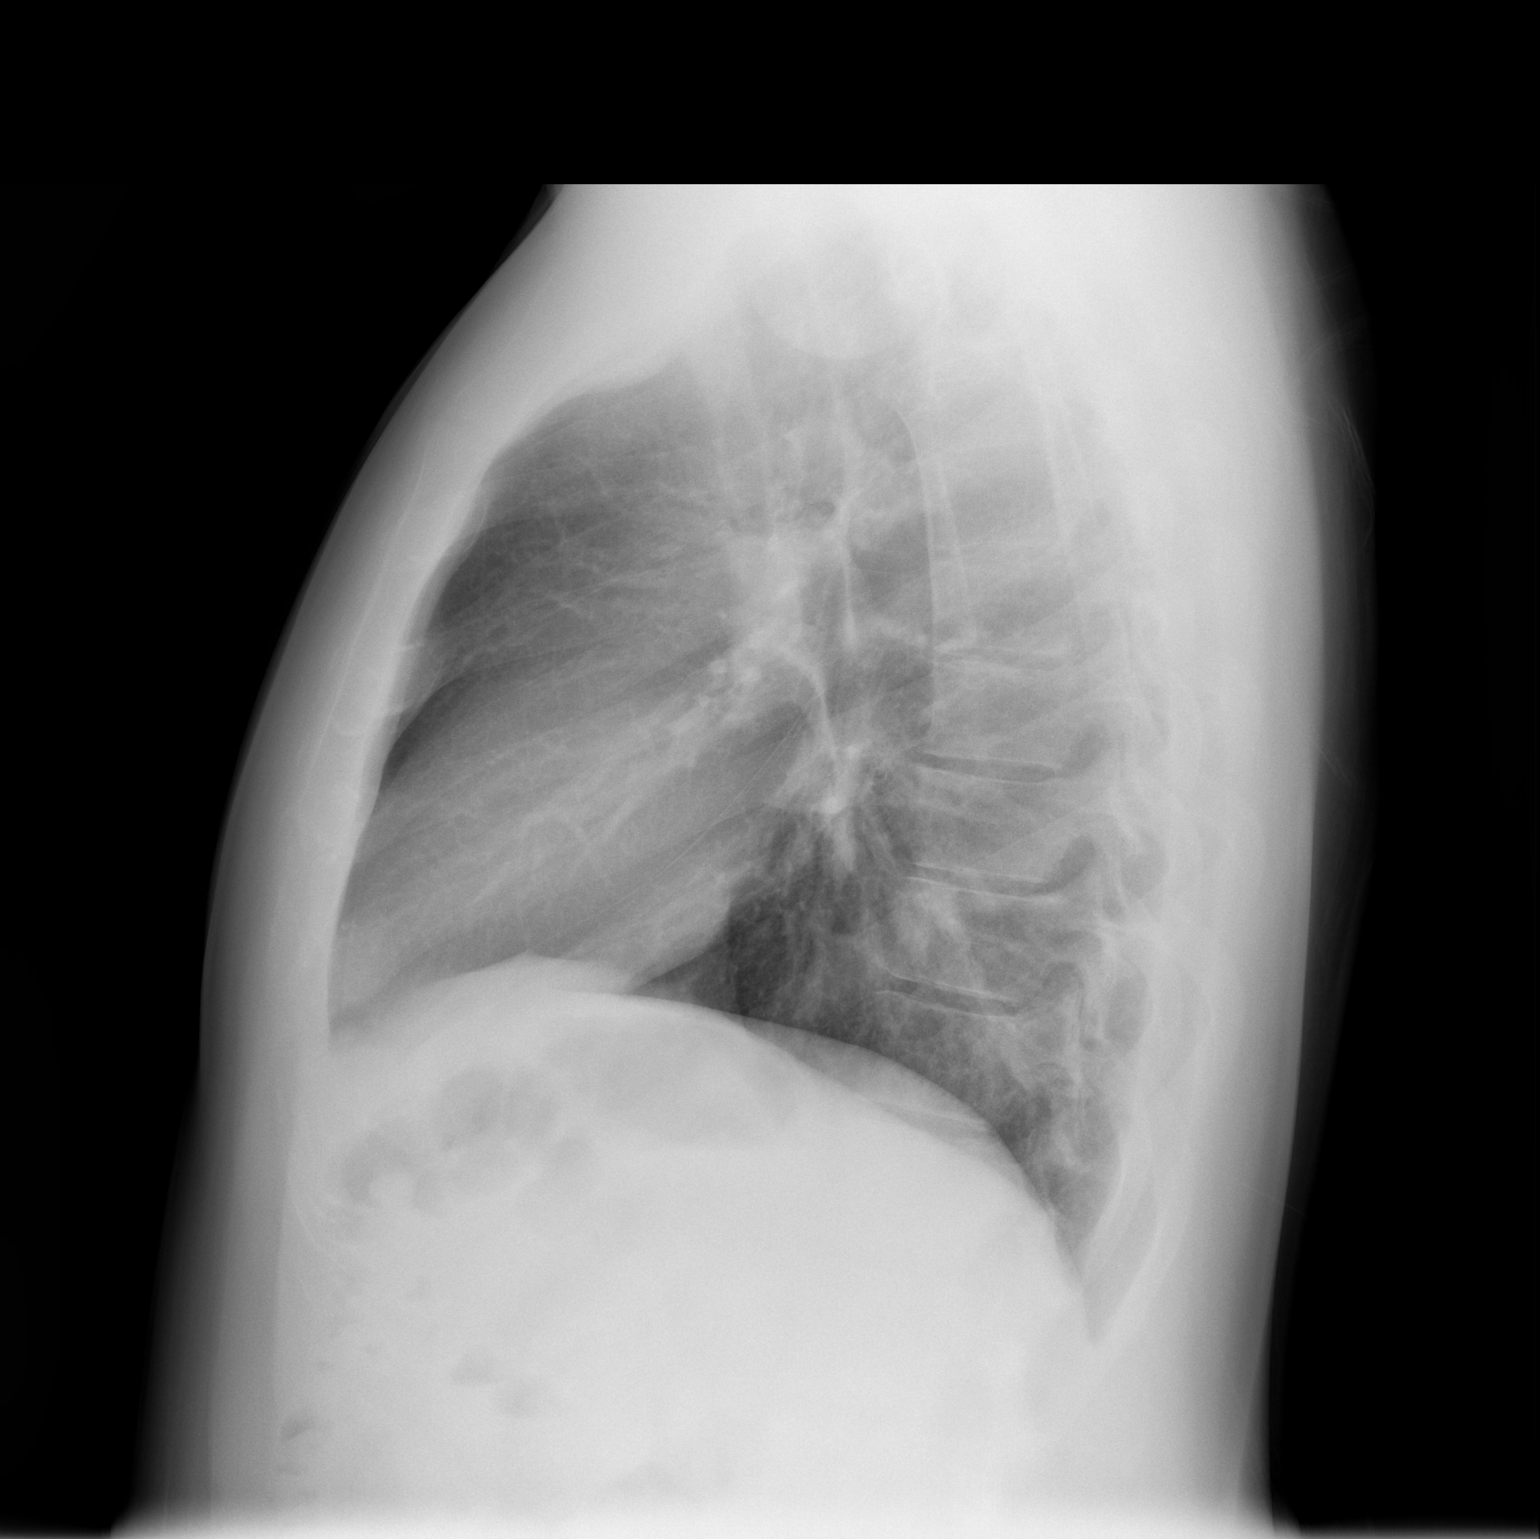

[2 of 2 positions shown; findings below may reference images not displayed]

FINDINGS: Cardiomediastinal silhouette is within normal limits.
The lungs are free of focal consolidations and pleural effusions.
No pulmonary edema. Visualized osseous structures have a normal
appearance.
IMPRESSION: Negative exam.

## 2014-01-31 ENCOUNTER — Encounter: Payer: Self-pay | Admitting: Internal Medicine

## 2014-01-31 ENCOUNTER — Ambulatory Visit (INDEPENDENT_AMBULATORY_CARE_PROVIDER_SITE_OTHER): Payer: 59 | Admitting: Internal Medicine

## 2014-01-31 VITALS — BP 120/74 | HR 75 | Temp 98.2°F | Wt 189.5 lb

## 2014-01-31 DIAGNOSIS — J40 Bronchitis, not specified as acute or chronic: Secondary | ICD-10-CM

## 2014-01-31 MED ORDER — HYDROCODONE-HOMATROPINE 5-1.5 MG/5ML PO SYRP: 5.0000 mL | ORAL_SOLUTION | Freq: Every evening | ORAL | Status: DC | PRN

## 2014-01-31 MED ORDER — AZITHROMYCIN 250 MG PO TABS: ORAL_TABLET | ORAL | Status: DC

## 2014-01-31 NOTE — Progress Notes (Signed)
Pre visit review using our clinic review tool, if applicable. No additional management support is needed unless otherwise documented below in the visit note. 

## 2014-01-31 NOTE — Patient Instructions (Signed)
Rest, fluids , tylenol For cough, take Mucinex DM twice a day as needed  Use hydrocodone at night if persistent cough For congestion use OTC Nasocort: 2 nasal sprays on each side of the nose daily until you feel better   Take the antibiotic as prescribed  (zithromax) Call if not gradually better over the next 3-4 days Call anytime if the symptoms are severe

## 2014-01-31 NOTE — Progress Notes (Signed)
   Subjective:    Patient ID: Dylan [name], male    DOB: 1985/11/11, 28 y.o.   MRN: [id number]  DOS:  01/31/2014 Type of visit - description : acute Interval history: Symptoms started 3 weeks ago with sinus pain, congestion and then symptoms "move" to the chest it he is having severe cough with occasional chest pressure. Taken Zyrtec without much help.   ROS No fever or chills No diarrhea or vomiting. Very little sputum production, small amounts, sometimes green. Cough has been mostly dry. No wheezing. Patient is a nonsmoker  Past Medical History  Diagnosis Date  . Hypertension   . OSA (obstructive sleep apnea)   . Anxiety   . Syncope   . Type 1 von Willebrand disease     Past Surgical History  Procedure Laterality Date  . Skin biopsy  2004    History   Social History  . Marital Status: Single    Spouse Name: N/A    Number of Children: 2  . Years of Education: N/A   Occupational History  . Occupational hygienist    Social History Main Topics  . Smoking status: Never Smoker   . Smokeless tobacco: Never Used  . Alcohol Use: Yes     Comment: rarely   . Drug Use: No  . Sexual Activity: No   Other Topics Concern  . Not on file   Social History Narrative   Married , 2 kids         Medication List       This list is accurate as of: 01/31/14  6:27 PM.  Always use your most recent med list.               azithromycin 250 MG tablet  Commonly known as:  ZITHROMAX Z-PAK  2 tabs the first day, then 1 tab a day x 4 days     HYDROcodone-homatropine 5-1.5 MG/5ML syrup  Commonly known as:  HYCODAN  Take 5 mLs by mouth at bedtime as needed for cough.     ZYRTEC PO  Take 1 tablet by mouth as needed.           Objective:   Physical Exam BP 120/74  Pulse 75  Temp(Src) 98.2 F (36.8 C) (Oral)  Wt 189 lb 8 oz (85.957 kg)  SpO2 98% General -- alert, well-developed, NAD.   HEENT-- Not pale. TMs slt  bulge B, no red or d/c. throat symmetric, no redness or  discharge.  Face symmetric, sinuses not tender to palpation. Nose slt  congested. Lungs -- normal respiratory effort, no intercostal retractions, no accessory muscle use, and normal breath sounds.  Heart-- normal rate, regular rhythm, no murmur.  A Extremities-- no pretibial edema bilaterally  Neurologic--  alert & oriented X3.   Psych-- Cognition and judgment appear intact. Cooperative with normal attention span and concentration. No anxious or depressed appearing.     Assessment & Plan:   Bronchitis, see instructions

## 2014-07-09 ENCOUNTER — Encounter: Payer: Self-pay | Admitting: Internal Medicine

## 2014-07-09 ENCOUNTER — Ambulatory Visit (INDEPENDENT_AMBULATORY_CARE_PROVIDER_SITE_OTHER): Payer: 59 | Admitting: Internal Medicine

## 2014-07-09 VITALS — BP 118/73 | HR 74 | Temp 98.5°F | Ht 73.0 in | Wt 193.4 lb

## 2014-07-09 DIAGNOSIS — Z0000 Encounter for general adult medical examination without abnormal findings: Secondary | ICD-10-CM

## 2014-07-09 LAB — CBC WITH DIFFERENTIAL/PLATELET
BASOS PCT: 0.4 % (ref 0.0–3.0)
Basophils Absolute: 0 10*3/uL (ref 0.0–0.1)
EOS ABS: 0 10*3/uL (ref 0.0–0.7)
EOS PCT: 0.9 % (ref 0.0–5.0)
HEMATOCRIT: 47 % (ref 39.0–52.0)
HEMOGLOBIN: 16.3 g/dL (ref 13.0–17.0)
LYMPHS ABS: 1.3 10*3/uL (ref 0.7–4.0)
Lymphocytes Relative: 31.3 % (ref 12.0–46.0)
MCHC: 34.7 g/dL (ref 30.0–36.0)
MCV: 89.4 fl (ref 78.0–100.0)
MONO ABS: 0.3 10*3/uL (ref 0.1–1.0)
Monocytes Relative: 6.9 % (ref 3.0–12.0)
NEUTROS ABS: 2.5 10*3/uL (ref 1.4–7.7)
NEUTROS PCT: 60.5 % (ref 43.0–77.0)
Platelets: 191 10*3/uL (ref 150.0–400.0)
RBC: 5.26 Mil/uL (ref 4.22–5.81)
RDW: 12 % (ref 11.5–15.5)
WBC: 4.1 10*3/uL (ref 4.0–10.5)

## 2014-07-09 LAB — TSH: TSH: 0.96 u[IU]/mL (ref 0.35–4.50)

## 2014-07-09 MED ORDER — AZELASTINE HCL 0.1 % NA SOLN: 2.0000 | Freq: Every evening | NASAL | Status: DC | PRN

## 2014-07-09 NOTE — Progress Notes (Signed)
Pre visit review using our clinic review tool, if applicable. No additional management support is needed unless otherwise documented below in the visit note. 

## 2014-07-09 NOTE — Addendum Note (Signed)
Addended by: Willow OraPAZ, Lanah Steines E on: 07/09/2014 09:16 PM   Modules accepted: Kipp BroodSmartSet

## 2014-07-09 NOTE — Assessment & Plan Note (Addendum)
Tdap 2013   Never had a cscope STE, diet and exercise discussed Labs   Other issues: History of a sleep apnea, not on CPAP, denies lack of energy History of hypertension, on no meds, BP completely normal, recommend to check BP from time to time Allergies, complain of cough from postnasal dripping, recommend Flonase and Claritin, if not better add Astelin, prescription provided Dry skin, heels, recommend moisturizers

## 2014-07-09 NOTE — Patient Instructions (Signed)
Get your blood work before you leave    Check the  blood pressure   monthly   Be sure your blood pressure is between 110/65 and  145/85.  if it is consistently higher or lower, let me know      Come back in 1 year  for a  office visit physical exam   Come back fasting    For allergies: Flonase or Nasacort OTC 2 sprays on each side of the nose every morning Claritin OTC as needed If your symptoms are not well controlled in 2 or 3 weeks, start Astelin nasal spray for nighttime.   Testicular Self-Exam A self-examination of your testicles involves looking at and feeling your testicles for abnormal lumps or swelling. Several things can cause swelling, lumps, or pain in your testicles. Some of these causes are:  Injuries.  Inflammation.  Infection.  Accumulation of fluids around your testicle (hydrocele).  Twisted testicles (testicular torsion).  Testicular cancer. Self-examination of the testicles and groin areas may be advised if you are at risk for testicular cancer. Risks for testicular cancer include:  An undescended testicle (cryptorchidism).  A history of previous testicular cancer.  A family history of testicular cancer. The testicles are easiest to examine after warm baths or showers and are more difficult to examine when you are cold. This is because the muscles attached to the testicles retract and pull them up higher or into the abdomen. Follow these steps while you are standing:  Hold your penis away from your body.  Roll one testicle between your thumb and forefinger, feeling the entire testicle.  Roll the other testicle between your thumb and forefinger, feeling the entire testicle. Feel for lumps, swelling, or discomfort. A normal testicle is egg shaped and feels firm. It is smooth and not tender. The spermatic cord can be felt as a firm spaghetti-like cord at the back of your testicle. It is also important to examine the crease between the front of your leg  and your abdomen. Feel for any bumps that are tender. These could be enlarged lymph nodes.  Document Released: 08/10/2000 Document Revised: 01/04/2013 Document Reviewed: 10/24/2012 [facility] Patient Information 2015 [employer] [place]. This information is not intended to replace advice given to you by your health care provider. Make sure you discuss any questions you have with your health care provider.

## 2014-07-09 NOTE — Progress Notes (Addendum)
Subjective:    Patient ID: Dylan [name], male    DOB: July 13, 1985, 29 y.o.   MRN: [id number]  DOS:  07/09/2014 Type of visit - description : cpx Interval history: In general doing well, has some concerns, see review of systems   Review of Systems  Constitutional: No fever, chills. No unexplained wt changes. No unusual sweats HEENT: No dental problems, ear discharge, facial swelling, voice changes. No eye discharge, redness or intolerance to light Denies sneezing, itchy eyes or itchy nose Respiratory: No wheezing or difficulty breathing.  + Cough on and off, most mornings, no sputum production but he has a lot of mucus in the throat that he spit up after coughing. + Postnasal dripping. Cardiovascular: No CP, leg swelling or palpitations GI: no nausea, vomiting, diarrhea or abdominal pain.  No blood in the stools. No dysphagia   Endocrine: No polyphagia, polyuria or polydipsia GU: No dysuria, gross hematuria, difficulty urinating. No urinary urgency or frequency. Musculoskeletal: No joint swellings or unusual aches or pains Skin: No change in the color of the skin, palor or rash; skin of the heel is quite  dry Allergic, immunologic: No environmental allergies or food allergies Neurological: No dizziness or syncope. No headaches. No diplopia, slurred speech, motor deficits, facial numbness Hematological: No enlarged lymph nodes, + easy bruising, and occasionally. Symptoms are at baseline. No gum bleeding Psychiatry: No suicidal ideas, hallucinations, behavior problems or confusion. No unusual/severe anxiety or depression.    Past Medical History  Diagnosis Date  . Hypertension   . OSA (obstructive sleep apnea)     no cpap, couldn't tolerate  . Anxiety   . Syncope   . Type 1 von Willebrand disease     Past Surgical History  Procedure Laterality Date  . Skin biopsy  2004    History   Social History  . Marital Status: Single    Spouse Name: N/A  . Number of Children: 2    . Years of Education: N/A   Occupational History  . Occupational hygienist    Social History Main Topics  . Smoking status: Never Smoker   . Smokeless tobacco: Never Used  . Alcohol Use: Yes     Comment: rarely   . Drug Use: No  . Sexual Activity: No   Other Topics Concern  . Not on file   Social History Narrative   Married , 2 kids      Family History  Problem Relation Age of Onset  . Colon cancer Neg Hx   . Prostate cancer Neg Hx   . Diabetes Other     GM, dx early   . CAD Other     uncle at age 54  . Stroke Mother     Read Drivers, uncle        Medication List       This list is accurate as of: 07/09/14  9:12 PM.  Always use your most recent med list.               azelastine 0.1 % nasal spray  Commonly known as:  ASTELIN  Place 2 sprays into both nostrils at bedtime as needed for rhinitis. Use in each nostril as directed     MULTIVITAMIN PO  Take 1 tablet by mouth daily.           Objective:   Physical Exam BP 118/73 mmHg  Pulse 74  Temp(Src) 98.5 F (36.9 C) (Oral)  Ht  (1.854 m)  Wt  193 lb 6 oz (87.714 kg)  BMI 25.52 kg/m2  SpO2 99% General:   Well developed, well nourished . NAD.  Neck:  Full range of motion. Supple. No  thyromegaly , normal carotid pulse, no LAD. HEENT:  Normocephalic . Face symmetric, atraumatic. TMs slightly bulged bilaterally, no red, no discharge. Nose congestion, throat without redness Lungs:  CTA B Normal respiratory effort, no intercostal retractions, no accessory muscle use. Heart: RRR,  no murmur.  Abdomen:  Not distended, soft, non-tender. No rebound or rigidity. No mass,organomegaly Muscle skeletal: no pretibial edema bilaterally  Skin: Exposed areas without rash. Not pale. Not jaundice. Skin at the heels quite dry and crackled, rest of the feet look okay, no moccasin type of rash Neurologic:  alert & oriented X3.  Speech normal, gait appropriate for age and unassisted Strength symmetric and appropriate for  age.  Psych: Cognition and judgment appear intact.  Cooperative with normal attention span and concentration.  Behavior appropriate. No anxious or depressed appearing.        Assessment & Plan:

## 2014-07-10 LAB — LIPID PANEL
CHOL/HDL RATIO: 4
CHOLESTEROL: 194 mg/dL (ref 0–200)
HDL: 52.1 mg/dL (ref >39.00)
LDL CALC: 123 mg/dL — AB (ref 0–99)
NonHDL: 141.9
TRIGLYCERIDES: 95 mg/dL (ref 0.0–149.0)
VLDL: 19 mg/dL (ref 0.0–40.0)

## 2014-07-10 LAB — BASIC METABOLIC PANEL
BUN: 15 mg/dL (ref 6–23)
CALCIUM: 10.1 mg/dL (ref 8.4–10.5)
CO2: 26 meq/L (ref 19–32)
CREATININE: 0.9 mg/dL (ref 0.40–1.50)
Chloride: 102 mEq/L (ref 96–112)
GFR: 104.96 mL/min (ref >60.00)
Glucose, Bld: 87 mg/dL (ref 70–99)
Potassium: 4.1 mEq/L (ref 3.5–5.1)
SODIUM: 138 meq/L (ref 135–145)

## 2014-08-15 ENCOUNTER — Telehealth: Payer: Self-pay | Admitting: Internal Medicine

## 2014-08-15 NOTE — Telephone Encounter (Signed)
Per Dr. Drue NovelPaz, if Rx nasal spray, OTC nasal spray, and claritin is not helping. Pt will need an OV to discuss further medications.

## 2014-08-15 NOTE — Telephone Encounter (Signed)
FYI. Please advise.

## 2014-08-15 NOTE — Telephone Encounter (Signed)
Pt voice understanding and will call back to schedule appointment

## 2014-08-15 NOTE — Telephone Encounter (Signed)
At the time of the last visit, we discussed allergies I recommend him to  use Flonase and Claritin. Also to start Astelin nasal spray if symptoms not well-controlled. If he is not better with  consistent use of all 3 medications--->  please arrange a office visit  ; steroids? Singulair?

## 2014-08-15 NOTE — Telephone Encounter (Signed)
Caller name: Jasmine PangGodfrey, Zamarian Relation to pt: self  Call back number: [phone] [id number] [address], [place] - [id number] [facility] [address] [phone] (Phone) [phone] (Fax)         Reason for call:  Pt experiencing congestion, cough, low grade fever and pressure in the face, he states the nasal spray is not working and requesting a RX. Pt was last seen 07/09/14 for PHYSICAL and states MD is aware of symptoms.

## 2014-08-21 ENCOUNTER — Encounter: Payer: Self-pay | Admitting: Medical

## 2014-08-21 ENCOUNTER — Ambulatory Visit (INDEPENDENT_AMBULATORY_CARE_PROVIDER_SITE_OTHER): Payer: 59 | Admitting: Medical

## 2014-08-21 VITALS — BP 133/84 | HR 64 | Temp 98.2°F | Ht 73.0 in | Wt 192.8 lb

## 2014-08-21 DIAGNOSIS — J301 Allergic rhinitis due to pollen: Secondary | ICD-10-CM

## 2014-08-21 DIAGNOSIS — J0100 Acute maxillary sinusitis, unspecified: Secondary | ICD-10-CM

## 2014-08-21 DIAGNOSIS — J309 Allergic rhinitis, unspecified: Secondary | ICD-10-CM | POA: Insufficient documentation

## 2014-08-21 MED ORDER — AZITHROMYCIN 250 MG PO TABS: ORAL_TABLET | ORAL | Status: DC

## 2014-08-21 MED ORDER — BENZONATATE 100 MG PO CAPS: 100.0000 mg | ORAL_CAPSULE | Freq: Three times a day (TID) | ORAL | Status: DC | PRN

## 2014-08-21 MED ORDER — METHYLPREDNISOLONE ACETATE 40 MG/ML IJ SUSP
40.0000 mg | Freq: Once | INTRAMUSCULAR | Status: AC
  Administered 2014-08-21: 40 mg via INTRAMUSCULAR

## 2014-08-21 NOTE — Assessment & Plan Note (Signed)
Some symptoms since end of feb and last 2 weeks seemed to reoccur. Use astelin. Add flonase and zyrtec. Also depomedrol 40 mg im today.

## 2014-08-21 NOTE — Assessment & Plan Note (Signed)
Your appear to have a sinus infection following/related to allergies. I am prescribing azithromycin ntibiotic for the infection. To help with the nasal congestion use your astelin and get flonase otc. For your associated cough, I prescribed cough medicine benzonatate. Rest, hydrate, tylenol for fever.  Follow up in 7 days or as needed.

## 2014-08-21 NOTE — Progress Notes (Signed)
Pre visit review using our clinic review tool, if applicable. No additional management support is needed unless otherwise documented below in the visit note. 

## 2014-08-21 NOTE — Patient Instructions (Signed)
Allergic rhinitis Some symptoms since end of feb and last 2 weeks seemed to reoccur. Use astelin. Add flonase and zyrtec. Also depomedrol 40 mg im today.   Sinusitis, acute, maxillary Your appear to have a sinus infection following/related to allergies. I am prescribing azithromycin ntibiotic for the infection. To help with the nasal congestion use your astelin and get flonase otc. For your associated cough, I prescribed cough medicine benzonatate. Rest, hydrate, tylenol for fever.  Follow up in 7 days or as needed.    Not mild st today. Azithromycin would treat in event strep throat present.

## 2014-08-21 NOTE — Progress Notes (Signed)
Subjective:    Patient ID: Dylan [name], male    DOB: April 18, 1986, 29 y.o.   MRN: [id number]  HPI  Pt in stating he is prone to sinus infections. Some sneezing, itchy eyes and runny nose x 2-3  wks. Pt taking zyrtec otc. Pt has some sinus pressure. Some productive cough. Pt is Astelin. Pt not on flonase.  Pt not having  fever.   [place] 3 wks ago one day of fever. None since.    Review of Systems  Constitutional: Negative for fever, chills and fatigue.  HENT: Positive for postnasal drip, rhinorrhea, sinus pressure, sneezing and sore throat.   Respiratory: Positive for cough. Negative for shortness of breath and wheezing.   Cardiovascular: Negative for chest pain and palpitations.  Musculoskeletal: Negative for back pain.  Neurological: Negative for dizziness and headaches.  Hematological: Negative for adenopathy. Does not bruise/bleed easily.  Psychiatric/Behavioral: Negative for confusion and agitation.    Past Medical History  Diagnosis Date  . Hypertension   . OSA (obstructive sleep apnea)     no cpap, couldn't tolerate  . Anxiety   . Syncope   . Type 1 von Willebrand disease     History   Social History  . Marital Status: Single    Spouse Name: N/A  . Number of Children: 2  . Years of Education: N/A   Occupational History  . Occupational hygienistretail manager    Social History Main Topics  . Smoking status: Never Smoker   . Smokeless tobacco: Never Used  . Alcohol Use: Yes     Comment: rarely   . Drug Use: No  . Sexual Activity: No   Other Topics Concern  . Not on file   Social History Narrative   Married , 2 kids     Past Surgical History  Procedure Laterality Date  . Skin biopsy  2004    Family History  Problem Relation Age of Onset  . Colon cancer Neg Hx   . Prostate cancer Neg Hx   . Diabetes Other     GM, dx early   . CAD Other     uncle at age 31  . Stroke Mother     M, Salvadore DomGF, uncle     No Known Allergies  Current Outpatient Prescriptions on File  Prior to Visit  Medication Sig Dispense Refill  . azelastine (ASTELIN) 0.1 % nasal spray Place 2 sprays into both nostrils at bedtime as needed for rhinitis. Use in each nostril as directed 30 mL 12  . Multiple Vitamins-Minerals (MULTIVITAMIN PO) Take 1 tablet by mouth daily.     No current facility-administered medications on file prior to visit.    BP 133/84 mmHg  Pulse 64  Temp(Src) 98.2 F (36.8 C) (Oral)  Ht 6\' 1"  (1.854 m)  Wt 192 lb 12.8 oz (87.454 kg)  BMI 25.44 kg/m2  SpO2 99%      Objective:   Physical Exam   General  Mental Status - Alert. General Appearance - Well groomed. Not in acute distress.  Skin Rashes- No Rashes.  HEENT Head- Normal. Ear Auditory Canal - Left- Normal. Right - Normal.Tympanic Membrane- Left- Normal. Right- Normal. Eye Sclera/Conjunctiva- Left- Normal. Right- Normal. Nose & Sinuses Nasal Mucosa- Left-  Boggy and Congested. Right-  Boggy and  Congested.Bilateral faint  maxillary and frontal sinus pressure. Mouth & Throat Lips: Upper Lip- Normal: no dryness, cracking, pallor, cyanosis, or vesicular eruption. Lower Lip-Normal: no dryness, cracking, pallor, cyanosis or vesicular eruption. Buccal Mucosa-  Bilateral- No Aphthous ulcers. Oropharynx- No Discharge or Erythema. +pnd. Tonsils: Characteristics- Bilateral- No Erythema or Congestion. Size/Enlargement- Bilateral- No enlargement. Discharge- bilateral-None.  Neck Neck- Supple. No Masses.   Chest and Lung Exam Auscultation: Breath Sounds:-Clear even and unlabored.  Cardiovascular Auscultation:Rythm- Regular, rate and rhythm. Murmurs & Other Heart Sounds:Ausculatation of the heart reveal- No Murmurs.  Lymphatic Head & Neck General Head & Neck Lymphatics: Bilateral: Description- No Localized lymphadenopathy.      Assessment & Plan:

## 2015-02-26 ENCOUNTER — Ambulatory Visit (INDEPENDENT_AMBULATORY_CARE_PROVIDER_SITE_OTHER): Payer: 59 | Admitting: Internal Medicine

## 2015-02-26 ENCOUNTER — Encounter: Payer: Self-pay | Admitting: Internal Medicine

## 2015-02-26 VITALS — BP 116/74 | HR 74 | Temp 98.1°F | Ht 73.0 in | Wt 192.0 lb

## 2015-02-26 DIAGNOSIS — J0100 Acute maxillary sinusitis, unspecified: Secondary | ICD-10-CM | POA: Diagnosis not present

## 2015-02-26 DIAGNOSIS — Z09 Encounter for follow-up examination after completed treatment for conditions other than malignant neoplasm: Secondary | ICD-10-CM | POA: Insufficient documentation

## 2015-02-26 MED ORDER — AMOXICILLIN 500 MG PO CAPS: 1000.0000 mg | ORAL_CAPSULE | Freq: Two times a day (BID) | ORAL | Status: DC

## 2015-02-26 NOTE — Patient Instructions (Signed)
Rest, fluids , tylenol  For cough: Take Mucinex DM twice a day as needed until better  Continue Flonase and Zyrtec  Get pseudoephedrine 30 mg (behind the counter, you need to talk with the pharmacist) take one tablet 3 or 4 times a day as needed for congestion  Take the antibiotic as prescribed  (Amoxicillin)  Call if not gradually better over the next  10 days  Call anytime if the symptoms are severe

## 2015-02-26 NOTE — Assessment & Plan Note (Signed)
Sinusitis: Symptoms most likely sinusitis, see instructions.

## 2015-02-26 NOTE — Progress Notes (Signed)
Pre visit review using our clinic review tool, if applicable. No additional management support is needed unless otherwise documented below in the visit note. 

## 2015-02-26 NOTE — Progress Notes (Signed)
Subjective:    Patient ID: Dylan [name], male    DOB: 1985/10/08, 29 y.o.   MRN: [id number]  DOS:  02/26/2015 Type of visit - description : Acute visit Interval history: Symptoms started 10 days ago with on and off cough, progressively getting more intense, occasionally associated with nausea. He is taking Zyrtec and Flonase with mild relief. Denies fever chills. Had sinus and the congestion bilaterally, initially had green discharge from the nose and now is a milky- yellow color. No vomiting or diarrhea. No myalgias No chest congestion    Review of Systems  See above Past Medical History  Diagnosis Date  . Hypertension   . OSA (obstructive sleep apnea)     no cpap, couldn't tolerate  . Anxiety   . Syncope   . Type 1 von Willebrand disease (HCC)     Past Surgical History  Procedure Laterality Date  . Skin biopsy  2004    Social History   Social History  . Marital Status: Single    Spouse Name: N/A  . Number of Children: 2  . Years of Education: N/A   Occupational History  . Occupational hygienist    Social History Main Topics  . Smoking status: Never Smoker   . Smokeless tobacco: Never Used  . Alcohol Use: Yes     Comment: rarely   . Drug Use: No  . Sexual Activity: No   Other Topics Concern  . Not on file   Social History Narrative   Married , 2 kids         Medication List       This list is accurate as of: 02/26/15  6:04 PM.  Always use your most recent med list.               amoxicillin 500 MG capsule  Commonly known as:  AMOXIL  Take 2 capsules (1,000 mg total) by mouth 2 (two) times daily.     azelastine 0.1 % nasal spray  Commonly known as:  ASTELIN  Place 2 sprays into both nostrils at bedtime as needed for rhinitis. Use in each nostril as directed     cetirizine 10 MG tablet  Commonly known as:  ZYRTEC  Take 10 mg by mouth daily.     MULTIVITAMIN PO  Take 1 tablet by mouth daily.           Objective:   Physical  Exam BP 116/74 mmHg  Pulse 74  Temp(Src) 98.1 F (36.7 C) (Oral)  Ht  (1.854 m)  Wt 192 lb (87.091 kg)  BMI 25.34 kg/m2  SpO2 97% General:   Well developed, well nourished . NAD.  HEENT:  Normocephalic . Face symmetric, atraumatic. TMs normal. Throat symmetric, minimal redness. Nose quite congested, sinuses tender bilateral maxillary area and right frontal area Lungs:  CTA B Normal respiratory effort, no intercostal retractions, no accessory muscle use. Heart: RRR,  no murmur.  No pretibial edema bilaterally  Skin: Not pale. Not jaundice Neurologic:  alert & oriented X3.  Speech normal, gait appropriate for age and unassisted Psych--  Cognition and judgment appear intact.  Cooperative with normal attention span and concentration.  Behavior appropriate. No anxious or depressed appearing.      Assessment & Plan:   Assessment> HTN OSA, CPAP intolerant Anxiety H/o recurrent  Syncope, DNKA w/ cards 2013 Type I long Willebrand's disease-- saw Dr Myna Hidalgo, avoid ASA,  if surgery needed contact hematology  Plan Sinusitis: Symptoms most likely  sinusitis, see instructions.

## 2015-04-16 ENCOUNTER — Encounter: Payer: Self-pay | Admitting: Internal Medicine

## 2015-04-16 ENCOUNTER — Ambulatory Visit (INDEPENDENT_AMBULATORY_CARE_PROVIDER_SITE_OTHER): Payer: 59 | Admitting: Internal Medicine

## 2015-04-16 VITALS — BP 114/74 | HR 85 | Temp 98.1°F | Ht 73.0 in | Wt 196.0 lb

## 2015-04-16 DIAGNOSIS — Z09 Encounter for follow-up examination after completed treatment for conditions other than malignant neoplasm: Secondary | ICD-10-CM

## 2015-04-16 DIAGNOSIS — J40 Bronchitis, not specified as acute or chronic: Secondary | ICD-10-CM

## 2015-04-16 MED ORDER — AZITHROMYCIN 250 MG PO TABS: ORAL_TABLET | ORAL | Status: DC

## 2015-04-16 MED ORDER — PREDNISONE 10 MG PO TABS: ORAL_TABLET | ORAL | Status: DC

## 2015-04-16 NOTE — Progress Notes (Signed)
Pre visit review using our clinic review tool, if applicable. No additional management support is needed unless otherwise documented below in the visit note. 

## 2015-04-16 NOTE — Patient Instructions (Signed)
Rest, fluids , tylenol  For cough: Take Mucinex DM twice a day as needed until better  For nasal congestion Use Astelin: 2 nasal sprays on each side of the nose daily until you feel better  Get pseudoephedrine 30 mg (behind the counter, you need to talk with the pharmacist) take one tablet 3 or 4 times a day as needed for congestion  Take the antibiotic as prescribed  : Zithromax  Take prednisone as prescribed  Call if not gradually better over the next  10 days  Call anytime if the symptoms are severe

## 2015-04-16 NOTE — Progress Notes (Signed)
Subjective:    Patient ID: Dylan [name], male    DOB: Jul 07, 1985, 29 y.o.   MRN: [id number]  DOS:  04/16/2015 Type of visit - description : acute Interval history: was seen several weeks ago with sinusitis, got amoxicillin, he recuperated 100%. A week ago, symptoms resurface this time he feels worse:Cough, sinus congestion, nosebleeds x 2, unable to breathe through his nose and having a difficult time sleeping    Review of Systems No fever chills Occasional nausea with episodes of cough. No myalgias or vomiting. + Sputum production and nasal discharge, light yellow in color.   Past Medical History  Diagnosis Date  . Hypertension   . OSA (obstructive sleep apnea)     no cpap, couldn't tolerate  . Anxiety   . Syncope   . Type 1 von Willebrand disease (HCC)     Past Surgical History  Procedure Laterality Date  . Skin biopsy  2004    Social History   Social History  . Marital Status: Single    Spouse Name: N/A  . Number of Children: 2  . Years of Education: N/A   Occupational History  . Occupational hygienistretail manager    Social History Main Topics  . Smoking status: Never Smoker   . Smokeless tobacco: Never Used  . Alcohol Use: Yes     Comment: rarely   . Drug Use: No  . Sexual Activity: No   Other Topics Concern  . Not on file   Social History Narrative   Married , 2 kids         Medication List       This list is accurate as of: 04/16/15 11:59 PM.  Always use your most recent med list.               azelastine 0.1 % nasal spray  Commonly known as:  ASTELIN  Place 2 sprays into both nostrils at bedtime as needed for rhinitis. Use in each nostril as directed     azithromycin 250 MG tablet  Commonly known as:  ZITHROMAX Z-PAK  2 tabs a day the first day, then 1 tab a day x 4 days     cetirizine 10 MG tablet  Commonly known as:  ZYRTEC  Take 10 mg by mouth daily.     dextromethorphan-guaiFENesin 30-600 MG 12hr tablet  Commonly known as:  MUCINEX DM    Take 1 tablet by mouth 2 (two) times daily as needed for cough.     MULTIVITAMIN PO  Take 1 tablet by mouth daily.     predniSONE 10 MG tablet  Commonly known as:  DELTASONE  4 tablets x 2 days, 3 tabs x 2 days, 2 tabs x 2 days, 1 tab x 2 days           Objective:   Physical Exam BP 114/74 mmHg  Pulse 85  Temp(Src) 98.1 F (36.7 C) (Oral)  Ht 6\' 1"  (1.854 m)  Wt 196 lb (88.905 kg)  BMI 25.86 kg/m2  SpO2 98% General:   Well developed, well nourished . NAD.  HEENT:  Normocephalic . Face symmetric, atraumatic. TMs bilaterally bulge but no red, no discharge. Nose quite congested. Throat symmetric, no red. Sinuses: TTP mostly at the left maxillary area and bilateral frontal sinuses. Lungs:  CTA B Normal respiratory effort, no intercostal retractions, no accessory muscle use. Heart: RRR,  no murmur.  No pretibial edema bilaterally  Skin: Not pale. Not jaundice Neurologic:  alert & oriented  X3.  Speech normal, gait appropriate for age and unassisted Psych--  Cognition and judgment appear intact.  Cooperative with normal attention span and concentration.  Behavior appropriate. No anxious or depressed appearing.      Assessment & Plan:   Assessment> HTN OSA, CPAP intolerant Anxiety H/o recurrent  Syncope, DNKA w/ cards 2013 Type I long Willebrand's disease-- saw Dr Myna Hidalgo, avoid ASA,  if surgery needed contact hematology   PLAN Sinusitis, mild bronchitis: Was seen several weeks ago with sinusitis, this seems to be a completely separate process. Will treat him in a standard fashion with a Z-Pak, prednisone. See instructions.Patient request a chest x-ray, I don't think that at this point there is a indication for imaging.

## 2015-04-17 NOTE — Assessment & Plan Note (Signed)
Sinusitis, mild bronchitis: Was seen several weeks ago with sinusitis, this seems to be a completely separate process. Will treat him in a standard fashion with a Z-Pak, prednisone. See instructions.Patient request a chest x-ray, I don't think that at this point there is a indication for imaging.

## 2015-05-10 ENCOUNTER — Telehealth: Payer: Self-pay | Admitting: Internal Medicine

## 2015-05-10 MED ORDER — AZELASTINE HCL 0.1 % NA SOLN: 2.0000 | Freq: Every evening | NASAL | Status: DC | PRN

## 2015-05-10 NOTE — Telephone Encounter (Signed)
Can't  prescribed antibiotics without office visit, recommend the following: Rest, fluids, Tylenol. Flonase OTC 2 sprays   every morning We'll send a prescription for Astelin 2 sprays in each side of the nose every night Office visit next week if not better, urgent care if symptoms severe

## 2015-05-10 NOTE — Telephone Encounter (Signed)
[phone] - , [place] - [address] [facility] [address]  Coughing constantly, headache, congestion & taken nyquil and mucinex. Having pressure again and coughing up lime green mucus. Pt concerned it's bronchitis again. 04/16/15 took prednisone and abx all the way thru. Pt is requesting meds again.

## 2015-05-10 NOTE — Telephone Encounter (Signed)
Spoke with Pt, informed him of recommendations. [facility] Pt verbalized understanding.

## 2015-05-10 NOTE — Telephone Encounter (Signed)
Please advise 

## 2015-08-21 ENCOUNTER — Other Ambulatory Visit (HOSPITAL_BASED_OUTPATIENT_CLINIC_OR_DEPARTMENT_OTHER): Payer: BLUE CROSS/BLUE SHIELD

## 2015-08-21 ENCOUNTER — Ambulatory Visit (HOSPITAL_BASED_OUTPATIENT_CLINIC_OR_DEPARTMENT_OTHER): Payer: BLUE CROSS/BLUE SHIELD | Admitting: Family

## 2015-08-21 ENCOUNTER — Encounter: Payer: Self-pay | Admitting: Family

## 2015-08-21 VITALS — BP 133/85 | HR 91 | Temp 98.2°F | Resp 18 | Wt 203.0 lb

## 2015-08-21 DIAGNOSIS — D6801 Von willebrand disease, type 1: Secondary | ICD-10-CM

## 2015-08-21 DIAGNOSIS — D680 Von Willebrand's disease: Secondary | ICD-10-CM | POA: Diagnosis not present

## 2015-08-21 LAB — COMPREHENSIVE METABOLIC PANEL
ALBUMIN: 4.7 g/dL (ref 3.5–5.0)
ALT: 27 U/L (ref 0–55)
ANION GAP: 10 meq/L (ref 3–11)
AST: 21 U/L (ref 5–34)
Alkaline Phosphatase: 100 U/L (ref 40–150)
BUN: 14 mg/dL (ref 7.0–26.0)
CALCIUM: 10.1 mg/dL (ref 8.4–10.4)
CO2: 29 mEq/L (ref 22–29)
CREATININE: 0.9 mg/dL (ref 0.7–1.3)
Chloride: 103 mEq/L (ref 98–109)
EGFR: >90 mL/min/{1.73_m2} (ref >90)
Glucose: 100 mg/dl (ref 70–140)
Potassium: 4 mEq/L (ref 3.5–5.1)
Sodium: 141 mEq/L (ref 136–145)
Total Bilirubin: 0.51 mg/dL (ref 0.20–1.20)
Total Protein: 8.4 g/dL — ABNORMAL HIGH (ref 6.4–8.3)

## 2015-08-21 LAB — CBC WITH DIFFERENTIAL (CANCER CENTER ONLY)
BASO#: 0 10*3/uL (ref 0.0–0.2)
BASO%: 0.4 % (ref 0.0–2.0)
EOS ABS: 0.1 10*3/uL (ref 0.0–0.5)
EOS%: 1.3 % (ref 0.0–7.0)
HEMATOCRIT: 45.9 % (ref 38.7–49.9)
HEMOGLOBIN: 16.7 g/dL (ref 13.0–17.1)
LYMPH#: 1.3 10*3/uL (ref 0.9–3.3)
LYMPH%: 28.4 % (ref 14.0–48.0)
MCH: 31.5 pg (ref 28.0–33.4)
MCHC: 36.4 g/dL — AB (ref 32.0–35.9)
MCV: 86 fL (ref 82–98)
MONO#: 0.4 10*3/uL (ref 0.1–0.9)
MONO%: 7.7 % (ref 0.0–13.0)
NEUT%: 62.2 % (ref 40.0–80.0)
NEUTROS ABS: 2.9 10*3/uL (ref 1.5–6.5)
Platelets: 188 10*3/uL (ref 145–400)
RBC: 5.31 10*6/uL (ref 4.20–5.70)
RDW: 11.8 % (ref 11.1–15.7)
WBC: 4.7 10*3/uL (ref 4.0–10.0)

## 2015-08-21 LAB — CHCC SATELLITE - SMEAR

## 2015-08-21 LAB — PROTIME-INR (CHCC SATELLITE)
INR: 1.1 — AB (ref 2.0–3.5)
Protime: 13.2 Seconds (ref 10.6–13.4)

## 2015-08-21 NOTE — Progress Notes (Signed)
Hematology and Oncology Follow Up Visit  Jen MowBradley W Yamashiro [id number] 21-Sep-1985 30 y.o. 08/21/2015   Principle Diagnosis:  Type 1 Von Willebrand disease  Current Therapy:   Observation    Interim History:  Mr. Glenetta HewGodfrey is here today for work-up before surgery. He will be having a vasectomy in April 18th.  He does bruise easily and has had a couple nose bleeds with the changes in the weather. He has had several sinus infections and was treated with antibiotics by his PCP.  No fever, chills, n/v, cough, rash, dizziness, vision changes, headaches, SOB, chest pain, palpitations, abdominal pain or changes in bowel or bladder habits.  No lymphadenopathy found on exam.  No swelling, tenderness, numbness or tingling in his extremities. No c/o joint aches or pains.  He has a good appetite and is staying well hydrated. His weight is stable.   Medications:    Medication List       This list is accurate as of: 08/21/15  1:42 PM.  Always use your most recent med list.               azelastine 0.1 % nasal spray  Commonly known as:  ASTELIN  Place 2 sprays into both nostrils at bedtime as needed for rhinitis. Use in each nostril as directed     cetirizine 10 MG tablet  Commonly known as:  ZYRTEC  Take 10 mg by mouth daily.     MULTIVITAMIN PO  Take 1 tablet by mouth daily.        Allergies: No Known Allergies  Past Medical History, Surgical history, Social history, and Family History were reviewed and updated.  Review of Systems: All other 10 point review of systems is negative.   Physical Exam:  weight is 203 lb (92.08 kg). His oral temperature is 98.2 F (36.8 C). His blood pressure is 133/85 and his pulse is 91. His respiration is 18.   Wt Readings from Last 3 Encounters:  08/21/15 203 lb (92.08 kg)  04/16/15 196 lb (88.905 kg)  02/26/15 192 lb (87.091 kg)    Ocular: Sclerae unicteric, pupils equal, round and reactive to light Ear-nose-throat: Oropharynx clear,  dentition fair Lymphatic: No cervical supraclavicular and axillary adenopathy Lungs no rales or rhonchi, good excursion bilaterally Heart regular rate and rhythm, no murmur appreciated Abd soft, nontender, positive bowel sounds, no liver or spleen tip palpated on exam, no fluid wave MSK no focal spinal tenderness, no joint edema Neuro: non-focal, well-oriented, appropriate affect Breasts: Deferred  Lab Results  Component Value Date   WBC 4.7 08/21/2015   HGB 16.7 08/21/2015   HCT 45.9 08/21/2015   MCV 86 08/21/2015   PLT 188 08/21/2015   No results found for: FERRITIN, IRON, TIBC, UIBC, IRONPCTSAT Lab Results  Component Value Date   RBC 5.31 08/21/2015   No results found for: KPAFRELGTCHN, LAMBDASER, KAPLAMBRATIO No results found for: IGGSERUM, IGA, IGMSERUM No results found for: Marda StalkerOTALPROTELP, ALBUMINELP, A1GS, A2GS, BETS, BETA2SER, GAMS, MSPIKE, SPEI   Chemistry      Component Value Date/Time   NA 141 08/21/2015 1135   NA 138 07/09/2014 1154   K 4.0 08/21/2015 1135   K 4.1 07/09/2014 1154   CL 102 07/09/2014 1154   CO2 29 08/21/2015 1135   CO2 26 07/09/2014 1154   BUN 14.0 08/21/2015 1135   BUN 15 07/09/2014 1154   CREATININE 0.9 08/21/2015 1135   CREATININE 0.90 07/09/2014 1154      Component Value Date/Time  CALCIUM 10.1 08/21/2015 1135   CALCIUM 10.1 07/09/2014 1154   ALKPHOS 100 08/21/2015 1135   ALKPHOS 80 04/21/2013 1345   AST 21 08/21/2015 1135   AST 19 04/21/2013 1345   ALT 27 08/21/2015 1135   ALT 23 04/21/2013 1345   BILITOT 0.51 08/21/2015 1135   BILITOT 0.7 04/21/2013 1345     Impression and Plan: Mr. Bucklin is a 30 yo white male with history of type 1 Von Willebrand's disease. He is back to see [place] today for work-up prior to having a vasectomy in 2 weeks.  His only surgical history was a mole removal from the left chest wall  Several years ago and had no issues with bleeding.  We will see what his lab work from today shows and plan to do a DDAVP  stimulation test on him next Tuesday (4/11) when he is off of work. This will determine what he will require prior to surgery.  We will follow-up with him once we receive his lab work.  He will contact us with any questions or concerns. We can certainly see him sooner if need be.   Verdie Mosher, NP 4/5/20171:42 PM

## 2015-08-22 ENCOUNTER — Ambulatory Visit: Payer: Self-pay

## 2015-08-22 DIAGNOSIS — D6801 Von willebrand disease, type 1: Secondary | ICD-10-CM

## 2015-08-22 DIAGNOSIS — D680 Von Willebrand's disease: Secondary | ICD-10-CM

## 2015-08-22 LAB — APTT: aPTT: 34 s — ABNORMAL HIGH (ref 24–33)

## 2015-08-23 LAB — VON WILLEBRAND PANEL
Factor VIII Activity: 67 % (ref 57–163)
PDF Image: 0
VON WILLEBRAND AG: 50 % (ref 50–200)
vWF Activity: 39 % — ABNORMAL LOW (ref 50–200)

## 2015-08-24 LAB — LUPUS ANTICOAGULANT PANEL
DRVVT CONFIRM: 1.4 ratio — AB (ref 0.8–1.2)
DRVVT MIX: 44.2 s — AB (ref 0.0–44.0)
HEXAGONAL PHASE PHOSPHOLIPID: 4 s (ref 0–11)
PTT-LA MIX: 49.7 s — AB (ref 0.0–40.6)
PTT-LA: 59.5 s — ABNORMAL HIGH (ref 0.0–43.6)
dRVVT: 57.2 s — ABNORMAL HIGH (ref 0.0–44.0)

## 2015-08-27 ENCOUNTER — Other Ambulatory Visit: Payer: BLUE CROSS/BLUE SHIELD

## 2015-08-27 ENCOUNTER — Other Ambulatory Visit: Payer: Self-pay | Admitting: Family

## 2015-08-27 ENCOUNTER — Ambulatory Visit (HOSPITAL_BASED_OUTPATIENT_CLINIC_OR_DEPARTMENT_OTHER): Payer: BLUE CROSS/BLUE SHIELD

## 2015-08-27 VITALS — BP 115/65 | HR 96 | Temp 98.2°F | Resp 18

## 2015-08-27 DIAGNOSIS — D6801 Von willebrand disease, type 1: Secondary | ICD-10-CM

## 2015-08-27 DIAGNOSIS — D680 Von Willebrand's disease: Secondary | ICD-10-CM

## 2015-08-27 MED ORDER — SODIUM CHLORIDE 0.9 % IV SOLN
0.3000 ug/kg | Freq: Once | INTRAVENOUS | Status: AC
  Administered 2015-08-27: 27.6 ug via INTRAVENOUS
  Filled 2015-08-27: qty 6.9

## 2015-08-27 MED ORDER — SODIUM CHLORIDE 0.9 % IV SOLN
INTRAVENOUS | Status: DC
  Administered 2015-08-27: 12:00:00 via INTRAVENOUS

## 2015-08-27 NOTE — Patient Instructions (Signed)
Desmopressin injection What is this medicine? DESMOPRESSIN (des moe PRESS in) is a man made hormone. It is used to treat or prevent bleeding in patients with Hemophilia A and von Willebrand's Disease. This medicine is also used to reduce urine production in patients with diabetes insipidus. This medicine may be used for other purposes; ask your health care provider or pharmacist if you have questions. What should I tell my health care provider before I take this medicine? They need to know if you have any of these conditions: -blood clot in the past -cystic fibrosis -heart disease -high blood pressure -kidney disease -low levels of sodium in the blood -an unusual or allergic reaction to desmopressin, vasopressin, other medicines, foods, dyes, or preservatives -pregnant or trying to get pregnant -breast-feeding How should I use this medicine? This medicine is infused into a vein or injected under the skin. It is usually given by a health care professional in a hospital or clinic setting. If you use this medicine at home, you will be taught how to prepare and give this medicine. Use exactly as directed. Take your medicine at regular intervals. Do not take your medicine more often than directed. It is important that you put your used needles and syringes in a special sharps container. Do not put them in a trash can. If you do not have a sharps container, call your pharmacist or healthcare provider to get one. Talk to your pediatrician regarding the use of this medicine in children. While this drug may be prescribed for selected conditions, precautions do apply. Overdosage: If you think you have taken too much of this medicine contact a poison control center or emergency room at once. NOTE: This medicine is only for you. Do not share this medicine with others. What if I miss a dose? If you miss a dose and it is almost time for your next dose, take only that dose. Do not take double or extra  doses. What may interact with this medicine? -alcohol -demeclocycline -medicines for asthma, breathing problems, colds -medicines for low blood pressure This list may not describe all possible interactions. Give your health care provider a list of all the medicines, herbs, non-prescription drugs, or dietary supplements you use. Also tell them if you smoke, drink alcohol, or use illegal drugs. Some items may interact with your medicine. What should I watch for while using this medicine? Visit your doctor or health care professional for regular check ups. You will need to have blood work done while you are taking this medicine. Talk with your doctor about how many glasses of fluid you need to drink a day. Only drink enough fluid to satisfy your thirst or as directed. Too much or not enough water can cause harm. What side effects may I notice from receiving this medicine? Side effects that you should report to your doctor or health care professional as soon as possible: -allergic reactions like skin rash, itching or hives, swelling of the face, lips, or tongue -change in blood pressure -chest pain, tightness -confusion -difficulty breathing -fast heart rate -retaining water -seizure -sudden weight gain -unusual bleeding, bruising -unusually weak or tired Side effects that usually do not require medical attention (report to your doctor or health care professional if they continue or are bothersome): -diarrhea -flushing, reddening of the skin -headache -nausea, vomiting -pain, redness where injected -stomach cramps -vaginal area pain This list may not describe all possible side effects. Call your doctor for medical advice about side effects. You may report  side effects to FDA at 1-800-FDA-1088. Where should I keep my medicine? Keep out of the reach of children. You will be instructed on how to store this medicine. Throw away any unused medicine after the expiration date on the  label. NOTE: This sheet is a summary. It may not cover all possible information. If you have questions about this medicine, talk to your doctor, pharmacist, or health care provider.    2016, Elsevier/Gold Standard. (2007-08-25 16:31:00)

## 2015-08-29 LAB — VON WILLEBRAND PANEL
FACTOR VIII ACTIVITY: 177 % — AB (ref 57–163)
PDF IMAGE: 0
VON WILLEBRAND AG: 149 % (ref 50–200)
vWF Activity: 113 % (ref 50–200)

## 2015-09-02 ENCOUNTER — Ambulatory Visit: Payer: BLUE CROSS/BLUE SHIELD

## 2015-09-02 ENCOUNTER — Other Ambulatory Visit: Payer: Self-pay | Admitting: *Deleted

## 2015-09-02 ENCOUNTER — Other Ambulatory Visit: Payer: Self-pay | Admitting: Hematology & Oncology

## 2015-09-02 DIAGNOSIS — D6801 Von willebrand disease, type 1: Secondary | ICD-10-CM

## 2015-09-02 DIAGNOSIS — D680 Von Willebrand's disease: Secondary | ICD-10-CM

## 2015-09-02 MED ORDER — SODIUM CHLORIDE 0.9 % IV SOLN
0.3033 ug/kg | Freq: Once | INTRAVENOUS | Status: DC
  Filled 2015-09-02: qty 7

## 2015-09-03 ENCOUNTER — Ambulatory Visit (HOSPITAL_BASED_OUTPATIENT_CLINIC_OR_DEPARTMENT_OTHER): Payer: BLUE CROSS/BLUE SHIELD

## 2015-09-03 VITALS — BP 133/81 | HR 87 | Temp 98.0°F | Resp 14

## 2015-09-03 DIAGNOSIS — D6801 Von willebrand disease, type 1: Secondary | ICD-10-CM

## 2015-09-03 DIAGNOSIS — D680 Von Willebrand's disease: Secondary | ICD-10-CM | POA: Diagnosis not present

## 2015-09-03 MED ORDER — SODIUM CHLORIDE 0.9 % IV SOLN
0.3033 ug/kg | Freq: Once | INTRAVENOUS | Status: AC
  Administered 2015-09-03: 28 ug via INTRAVENOUS
  Filled 2015-09-03: qty 7

## 2015-09-03 NOTE — Patient Instructions (Signed)
Desmopressin injection What is this medicine? DESMOPRESSIN (des moe PRESS in) is a man made hormone. It is used to treat or prevent bleeding in patients with Hemophilia A and von Willebrand's Disease. This medicine is also used to reduce urine production in patients with diabetes insipidus. This medicine may be used for other purposes; ask your health care provider or pharmacist if you have questions. What should I tell my health care provider before I take this medicine? They need to know if you have any of these conditions: -blood clot in the past -cystic fibrosis -heart disease -high blood pressure -kidney disease -low levels of sodium in the blood -an unusual or allergic reaction to desmopressin, vasopressin, other medicines, foods, dyes, or preservatives -pregnant or trying to get pregnant -breast-feeding How should I use this medicine? This medicine is infused into a vein or injected under the skin. It is usually given by a health care professional in a hospital or clinic setting. If you use this medicine at home, you will be taught how to prepare and give this medicine. Use exactly as directed. Take your medicine at regular intervals. Do not take your medicine more often than directed. It is important that you put your used needles and syringes in a special sharps container. Do not put them in a trash can. If you do not have a sharps container, call your pharmacist or healthcare provider to get one. Talk to your pediatrician regarding the use of this medicine in children. While this drug may be prescribed for selected conditions, precautions do apply. Overdosage: If you think you have taken too much of this medicine contact a poison control center or emergency room at once. NOTE: This medicine is only for you. Do not share this medicine with others. What if I miss a dose? If you miss a dose and it is almost time for your next dose, take only that dose. Do not take double or extra  doses. What may interact with this medicine? -alcohol -demeclocycline -medicines for asthma, breathing problems, colds -medicines for low blood pressure This list may not describe all possible interactions. Give your health care provider a list of all the medicines, herbs, non-prescription drugs, or dietary supplements you use. Also tell them if you smoke, drink alcohol, or use illegal drugs. Some items may interact with your medicine. What should I watch for while using this medicine? Visit your doctor or health care professional for regular check ups. You will need to have blood work done while you are taking this medicine. Talk with your doctor about how many glasses of fluid you need to drink a day. Only drink enough fluid to satisfy your thirst or as directed. Too much or not enough water can cause harm. What side effects may I notice from receiving this medicine? Side effects that you should report to your doctor or health care professional as soon as possible: -allergic reactions like skin rash, itching or hives, swelling of the face, lips, or tongue -change in blood pressure -chest pain, tightness -confusion -difficulty breathing -fast heart rate -retaining water -seizure -sudden weight gain -unusual bleeding, bruising -unusually weak or tired Side effects that usually do not require medical attention (report to your doctor or health care professional if they continue or are bothersome): -diarrhea -flushing, reddening of the skin -headache -nausea, vomiting -pain, redness where injected -stomach cramps -vaginal area pain This list may not describe all possible side effects. Call your doctor for medical advice about side effects. You may report   side effects to FDA at 1-800-FDA-1088. Where should I keep my medicine? Keep out of the reach of children. You will be instructed on how to store this medicine. Throw away any unused medicine after the expiration date on the  label. NOTE: This sheet is a summary. It may not cover all possible information. If you have questions about this medicine, talk to your doctor, pharmacist, or health care provider.    2016, Elsevier/Gold Standard. (2007-08-25 16:31:00)  

## 2015-12-12 ENCOUNTER — Telehealth: Payer: Self-pay | Admitting: Internal Medicine

## 2015-12-12 NOTE — Telephone Encounter (Signed)
Pt will need an appt w/ PCP to discuss. Last OV 03/2015.

## 2015-12-12 NOTE — Telephone Encounter (Signed)
Relationship to patient: self Can be reached: [phone]  Reason for call: Pt calling about potential RX for anxiety. He is requesting call back from nurse/CMA.

## 2015-12-12 NOTE — Telephone Encounter (Signed)
Pt stated it was previously discussed with Dr. Drue Novel and that's why he was requesting a call.

## 2015-12-12 NOTE — Telephone Encounter (Signed)
Please advise 

## 2015-12-13 NOTE — Telephone Encounter (Signed)
Per Dr. Drue Novel, last OV note reviewed, nothing mentioned of Sx's. Please call Pt he will need an appt before medications can be prescribed.

## 2015-12-17 ENCOUNTER — Ambulatory Visit (INDEPENDENT_AMBULATORY_CARE_PROVIDER_SITE_OTHER): Payer: BLUE CROSS/BLUE SHIELD | Admitting: Internal Medicine

## 2015-12-17 ENCOUNTER — Encounter: Payer: Self-pay | Admitting: Internal Medicine

## 2015-12-17 VITALS — BP 108/62 | HR 67 | Temp 98.0°F | Resp 14 | Ht 75.0 in | Wt 208.0 lb

## 2015-12-17 DIAGNOSIS — F419 Anxiety disorder, unspecified: Secondary | ICD-10-CM

## 2015-12-17 MED ORDER — ESCITALOPRAM OXALATE 10 MG PO TABS: 10.0000 mg | ORAL_TABLET | Freq: Every day | ORAL | 1 refills | Status: DC

## 2015-12-17 NOTE — Progress Notes (Signed)
Subjective:    Patient ID: Dylan [name], male    DOB: 1985/10/26, 30 y.o.   MRN: [id number]  DOS:  12/17/2015 Type of visit - description : acute Interval history: Anxiety: Reported long history of  anxiety described as feeling worried all the time, can't stop thinking about things, second-guessing himself frequently and sometimes losing his focus. Wife thinks he is very anxious, patient agrees.  I wonder if he has ADD as well but he reports the lack of focus is not a major symptom.  Also, right knee swelling on and off,  at work he is on his feet all the time. Denies any injury, sometimes feels a "grinding feeling" in the knee but no locking.  Review of Systems  No depression History of a sleep apnea, untreated, denies feeling sleepy throughout the day.  Past Medical History:  Diagnosis Date  . Anxiety   . Hypertension   . OSA (obstructive sleep apnea)    no cpap, couldn't tolerate  . Syncope   . Type 1 von Willebrand disease (HCC)     Past Surgical History:  Procedure Laterality Date  . SKIN BIOPSY  2004    Social History   Social History  . Marital status: Married    Spouse name: N/A  . Number of children: 2  . Years of education: N/A   Occupational History  . 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 Smoking status: Never Smoker  . Smokeless tobacco: Never Used  . Alcohol use Yes     Comment: rarely   . Drug use: No  . Sexual activity: Yes   Other Topics Concern  . Not on file   Social History Narrative   Married , 2 kids         Medication List       Accurate as of 12/17/15  6:05 PM. Always use your most recent med list.          azelastine 0.1 % nasal spray Commonly known as:  ASTELIN Place 2 sprays into both nostrils at bedtime as needed for rhinitis. Use in each nostril as directed   cetirizine 10 MG tablet Commonly known as:  ZYRTEC Take 10 mg by mouth daily.   escitalopram 10 MG tablet Commonly known as:   LEXAPRO Take 1 tablet (10 mg total) by mouth daily.   MULTIVITAMIN PO Take 1 tablet by mouth daily.          Objective:   Physical Exam BP 108/62 (BP Location: Left Arm, Patient Position: Sitting, Cuff Size: Normal)   Pulse 67   Temp 98 F (36.7 C) (Oral)   Resp 14   Ht  (1.905 m)   Wt 208 lb (94.3 kg)   SpO2 99%   BMI 26.00 kg/m  General:   Well developed, well nourished . NAD.  HEENT:  Normocephalic . Face symmetric, atraumatic MSK: Left knee normal Right knee: Minimal swelling on exam if any. Range of motion normal, no deformities, no red or warm. Skin: Not pale. Not jaundice Neurologic:  alert & oriented X3.  Speech normal, gait appropriate for age and unassisted Psych--  Cognition and judgment appear intact.  Cooperative with normal attention span and concentration.  Behavior appropriate. No anxious or depressed appearing.      Assessment & Plan:    Assessment> HTN OSA, CPAP intolerant Anxiety H/o recurrent  Syncope, DNKA w/ cards 2013 Type I long Willebrand's disease-- saw Dr Myna Hidalgo, avoid ASA,  if surgery needed contact hematology   PLAN Anxiety: sx are consistent with anxiety although he may have some degree of ADD. Sx may be aggravated by his history of sleep apnea (not on CPAP) however he does not feel sleepy.  Plan: I recommend counseling as an important part of the treatment. We also discussed medication and he is ready to try. Rx Lexapro, s/e discussed, Knee pain: Recommend brace, eyes and Tylenol. If severe or recurrent symptoms will call for a referral. RTC 4-6 weeks

## 2015-12-17 NOTE — Assessment & Plan Note (Signed)
Anxiety: sx are consistent with anxiety although he may have some degree of ADD. Sx may be aggravated by his history of sleep apnea (not on CPAP) however he does not feel sleepy.  Plan: I recommend counseling as an important part of the treatment. We also discussed medication and he is ready to try. Rx Lexapro, s/e discussed, Knee pain: Recommend brace, eyes and Tylenol. If severe or recurrent symptoms will call for a referral. RTC 4-6 weeks

## 2015-12-17 NOTE — Progress Notes (Signed)
Pre visit review using our clinic review tool, if applicable. No additional management support is needed unless otherwise documented below in the visit note. 

## 2015-12-17 NOTE — Patient Instructions (Signed)
GO TO THE FRONT DESK Schedule your next appointment for a  Check up in 4 to 6 weeks   For anxiety: Consider see a counselor Start Lexapro, take 1 tablet every night   For the knee: Use a brace regularly, ice, Tylenol

## 2016-01-15 ENCOUNTER — Encounter: Payer: Self-pay | Admitting: Internal Medicine

## 2016-01-15 ENCOUNTER — Ambulatory Visit (INDEPENDENT_AMBULATORY_CARE_PROVIDER_SITE_OTHER): Payer: BLUE CROSS/BLUE SHIELD | Admitting: Internal Medicine

## 2016-01-15 VITALS — BP 126/72 | HR 72 | Temp 98.2°F | Resp 14 | Ht 75.0 in | Wt 210.1 lb

## 2016-01-15 DIAGNOSIS — F419 Anxiety disorder, unspecified: Secondary | ICD-10-CM

## 2016-01-15 MED ORDER — ESCITALOPRAM OXALATE 20 MG PO TABS: 20.0000 mg | ORAL_TABLET | Freq: Every day | ORAL | 3 refills | Status: DC

## 2016-01-15 NOTE — Patient Instructions (Signed)
Increase lexapro to 20 mg a day  If you feel is too much, ok to go down to lexapro 10 mg: 1.5 tabs a day  Next visit in 2-3 months

## 2016-01-15 NOTE — Progress Notes (Signed)
Subjective:    Patient ID: Dylan [name], male    DOB: 16-Jul-1985, 30 y.o.   MRN: [id number]  DOS:  01/15/2016 Type of visit - description : Follow-up Interval history: Since the last office visit, he is taking 10 mg of Lexapro, he is feeling about the same. 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 He feels that as long as he continue with that amount of stress he won't be able to feel back to normal.   Review of Systems Had some nausea when he started to take Lexapro but that's largely gone. No suicidal ideas   Past Medical History:  Diagnosis Date  . Anxiety   . Hypertension   . OSA (obstructive sleep apnea)    no cpap, couldn't tolerate  . Syncope   . Type 1 von Willebrand disease (HCC)     Past Surgical History:  Procedure Laterality Date  . SKIN BIOPSY  2004    Social History   Social History  . Marital status: Married    Spouse name: N/A  . Number of children: 2  . Years of education: N/A   Occupational History  . 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 Smoking status: Never Smoker  . Smokeless tobacco: Never Used  . Alcohol use Yes     Comment: rarely   . Drug use: No  . Sexual activity: Yes   Other Topics Concern  . Not on file   Social History Narrative   Married , 2 kids         Medication List       Accurate as of 01/15/16 11:59 PM. Always use your most recent med list.          azelastine 0.1 % nasal spray Commonly known as:  ASTELIN Place 2 sprays into both nostrils at bedtime as needed for rhinitis. Use in each nostril as directed   cetirizine 10 MG tablet Commonly known as:  ZYRTEC Take 10 mg by mouth daily.   escitalopram 20 MG tablet Commonly known as:  LEXAPRO Take 1 tablet (20 mg total) by mouth daily.   MULTIVITAMIN PO Take 1 tablet by mouth daily.          Objective:   Physical Exam BP 126/72 (BP  Location: Left Arm, Patient Position: Sitting, Cuff Size: Normal)   Pulse 72   Temp 98.2 F (36.8 C) (Oral)   Resp 14   Ht 6\' 3"  (1.905 m)   Wt 210 lb 2 oz (95.3 kg)   SpO2 98%   BMI 26.26 kg/m  General:   Well developed, well nourished . NAD.  HEENT:  Normocephalic . Face symmetric, atraumatic Skin: Not pale. Not jaundice Neurologic:  alert & oriented X3.  Speech normal, gait appropriate for age and unassisted Psych--  Cognition and judgment appear intact.  Cooperative with normal attention span and concentration.  Behavior appropriate. No anxious or depressed appearing.      Assessment & Plan:   Assessment> HTN OSA, CPAP intolerant Anxiety H/o recurrent  Syncope, DNKA w/ cards 2013 Type I long Willebrand's disease-- saw Dr Myna HidalgoEnnever, avoid ASA,  if surgery needed contact hematology   PLAN Anxiety: Since the last visit he started Lexapro, good compliance, no apparent side effects. Feels about the same, see HPI. Patient is counseled to the best of my ability.  Medication management options are:  No change, increased dose or change to another agent. We elected to increase to Lexapro 20 mg, okay to go down to 15 mg if needed. Watch for s/e. Counseling will be great but he has very little time. RTC 3 months, sooner if needed.Marland Kitchen

## 2016-01-15 NOTE — Progress Notes (Signed)
Pre visit review using our clinic review tool, if applicable. No additional management support is needed unless otherwise documented below in the visit note. 

## 2016-01-16 NOTE — Assessment & Plan Note (Signed)
Anxiety: Since the last visit he started Lexapro, good compliance, no apparent side effects. Feels about the same, see HPI. Patient is counseled to the best of my ability.  Medication management options are: No change, increased dose or change to another agent. We elected to increase to Lexapro 20 mg, okay to go down to 15 mg if needed. Watch for s/e. Counseling will be great but he has very little time. RTC 3 months, sooner if needed..Marland Kitchen

## 2016-04-15 ENCOUNTER — Ambulatory Visit: Payer: BLUE CROSS/BLUE SHIELD | Admitting: Internal Medicine

## 2016-07-17 ENCOUNTER — Ambulatory Visit (INDEPENDENT_AMBULATORY_CARE_PROVIDER_SITE_OTHER): Payer: BLUE CROSS/BLUE SHIELD | Admitting: Family Medicine

## 2016-07-17 VITALS — BP 124/84 | HR 96 | Temp 99.3°F | Resp 16 | Ht 75.0 in | Wt 210.0 lb

## 2016-07-17 DIAGNOSIS — J0101 Acute recurrent maxillary sinusitis: Secondary | ICD-10-CM

## 2016-07-17 MED ORDER — BENZONATATE 100 MG PO CAPS: 100.0000 mg | ORAL_CAPSULE | Freq: Three times a day (TID) | ORAL | 0 refills | Status: DC | PRN

## 2016-07-17 MED ORDER — AMOXICILLIN-POT CLAVULANATE 875-125 MG PO TABS: 1.0000 | ORAL_TABLET | Freq: Two times a day (BID) | ORAL | 0 refills | Status: DC

## 2016-07-17 NOTE — Progress Notes (Signed)
Chief Complaint  Patient presents with  . URI    Dylan [name] here for URI complaints.  Duration: 4 days  Associated symptoms: sinus congestion, sinus pain, rhinorrhea, sore throat and cough Denies: itchy watery eyes, ear pain, ear drainage, shortness of breath and myalgia Treatment to date: Advil cold and flu Sick contacts: No  ROS:  Const: Denies fevers HEENT: As noted in HPI Lungs: No SOB  Past Medical History:  Diagnosis Date  . Anxiety   . Hypertension   . OSA (obstructive sleep apnea)    no cpap, couldn't tolerate  . Syncope   . Type 1 von Willebrand disease (HCC)    Family History  Problem Relation Age of Onset  . Diabetes Other     GM, dx early   . CAD Other     uncle at age 33  . Stroke Mother     Read DriversM, GF, uncle   . Colon cancer Neg Hx   . Prostate cancer Neg Hx     BP 124/84   Pulse 96   Temp 99.3 F (37.4 C) (Oral)   Resp 16   Ht 6\' 3"  (1.905 m)   Wt 210 lb (95.3 kg)   SpO2 99%   BMI 26.25 kg/m  General: Awake, alert, appears stated age HEENT: AT, [place], ears patent b/l and TM's neg, nares patent w/o discharge, +TTP over max sinuses b/l, pharynx pink and without exudates, MMM Neck: No masses or asymmetry Heart: RRR, no murmurs, no bruits Lungs: CTAB, no accessory muscle use Psych: Age appropriate judgment and insight, normal mood and affect  Acute recurrent maxillary sinusitis - Plan: amoxicillin-clavulanate (AUGMENTIN) 875-125 MG tablet, benzonatate (TESSALON) 100 MG capsule  Orders as above. If no improvement by day 14, worsening by day 10 or consistent worsening over 3 days, OK to take abx.  Continue to push fluids, practice good hand hygiene, cover mouth when coughing. F/u prn. If starting to experience fevers, shaking, or shortness of breath, seek immediate care. Pt voiced understanding and agreement to the plan.  Jilda Rocheicholas Paul [place], DO 07/17/16 3:03 PM

## 2016-07-17 NOTE — Patient Instructions (Addendum)
Continue to push fluids, practice good hand hygiene, and cover your mouth if you cough.  If you start having fevers, shaking or shortness of breath, seek immediate care.  If you are worsening for 3 days, worsening by day 10, or not improving by day 14, take antibiotic.

## 2016-08-13 ENCOUNTER — Other Ambulatory Visit: Payer: Self-pay | Admitting: Internal Medicine

## 2016-10-13 ENCOUNTER — Other Ambulatory Visit: Payer: Self-pay | Admitting: *Deleted

## 2016-10-13 ENCOUNTER — Other Ambulatory Visit: Payer: Self-pay | Admitting: Family

## 2016-10-13 DIAGNOSIS — D680 Von Willebrand's disease: Secondary | ICD-10-CM

## 2016-10-13 DIAGNOSIS — D6801 Von willebrand disease, type 1: Secondary | ICD-10-CM

## 2016-10-14 ENCOUNTER — Ambulatory Visit (HOSPITAL_BASED_OUTPATIENT_CLINIC_OR_DEPARTMENT_OTHER): Payer: BLUE CROSS/BLUE SHIELD | Admitting: Family

## 2016-10-14 ENCOUNTER — Other Ambulatory Visit (HOSPITAL_BASED_OUTPATIENT_CLINIC_OR_DEPARTMENT_OTHER): Payer: BLUE CROSS/BLUE SHIELD

## 2016-10-14 VITALS — BP 142/92 | HR 80 | Temp 98.9°F | Resp 17 | Wt 210.0 lb

## 2016-10-14 DIAGNOSIS — D680 Von Willebrand's disease: Secondary | ICD-10-CM

## 2016-10-14 DIAGNOSIS — D6801 Von willebrand disease, type 1: Secondary | ICD-10-CM

## 2016-10-14 LAB — COMPREHENSIVE METABOLIC PANEL
ALT: 30 U/L (ref 0–55)
ANION GAP: 11 meq/L (ref 3–11)
AST: 21 U/L (ref 5–34)
Albumin: 4.7 g/dL (ref 3.5–5.0)
Alkaline Phosphatase: 104 U/L (ref 40–150)
BUN: 14.2 mg/dL (ref 7.0–26.0)
CALCIUM: 9.9 mg/dL (ref 8.4–10.4)
CHLORIDE: 104 meq/L (ref 98–109)
CO2: 26 mEq/L (ref 22–29)
CREATININE: 1 mg/dL (ref 0.7–1.3)
EGFR: >90 mL/min/{1.73_m2} (ref >90)
Glucose: 107 mg/dl (ref 70–140)
Potassium: 3.8 mEq/L (ref 3.5–5.1)
Sodium: 142 mEq/L (ref 136–145)
Total Bilirubin: 0.53 mg/dL (ref 0.20–1.20)
Total Protein: 8 g/dL (ref 6.4–8.3)

## 2016-10-14 LAB — CHCC SATELLITE - SMEAR

## 2016-10-14 LAB — CBC WITH DIFFERENTIAL (CANCER CENTER ONLY)
BASO#: 0 10*3/uL (ref 0.0–0.2)
BASO%: 0.2 % (ref 0.0–2.0)
EOS ABS: 0.1 10*3/uL (ref 0.0–0.5)
EOS%: 1.9 % (ref 0.0–7.0)
HCT: 44.9 % (ref 38.7–49.9)
HGB: 16.2 g/dL (ref 13.0–17.1)
LYMPH#: 1.2 10*3/uL (ref 0.9–3.3)
LYMPH%: 28 % (ref 14.0–48.0)
MCH: 31.6 pg (ref 28.0–33.4)
MCHC: 36.1 g/dL — ABNORMAL HIGH (ref 32.0–35.9)
MCV: 88 fL (ref 82–98)
MONO#: 0.3 10*3/uL (ref 0.1–0.9)
MONO%: 6.9 % (ref 0.0–13.0)
NEUT#: 2.7 10*3/uL (ref 1.5–6.5)
NEUT%: 63 % (ref 40.0–80.0)
PLATELETS: 180 10*3/uL (ref 145–400)
RBC: 5.13 10*6/uL (ref 4.20–5.70)
RDW: 12.5 % (ref 11.1–15.7)
WBC: 4.2 10*3/uL (ref 4.0–10.0)

## 2016-10-14 NOTE — Progress Notes (Signed)
Hematology and Oncology Follow Up Visit  Dylan [name] [id number] 02/06/1986 31 y.o. 10/14/2016   Principle Diagnosis:  Type 1 Von Willebrand disease  Current Therapy:   Observation   Interim History:  Dylan [name] is here today for follow-up. He will be having his wisdom teeth taken out in the next week or so. We will check his counts today and see about arranging the DDAVP prior to the procedure.  He still bruises easily and had one recent nose bleed that stopped in under 15 minutes. He attributes this to the pollen and working a lot outside.  No fever, chills, n/v, cough, rash, dizziness, SOB, chest pain, palpitations, abdominal pain or changes in bowel or bladder habits.  No lymphadenopathy found on exam. No swelling, tenderness, numbness or tingling in his extremities. No c/o pain.  He has maintained a good appetite and is staying well hydrated. His weight is stable.   ECOG Performance Status: 1 - Symptomatic but completely ambulatory  Medications:  Allergies as of 10/14/2016   No Known Allergies     Medication List       Accurate as of 10/14/16  8:45 AM. Always use your most recent med list.          amoxicillin-clavulanate 875-125 MG tablet Commonly known as:  AUGMENTIN Take 1 tablet by mouth 2 (two) times daily.   azelastine 0.1 % nasal spray Commonly known as:  ASTELIN Place 2 sprays into both nostrils at bedtime as needed for rhinitis. Use in each nostril as directed   benzonatate 100 MG capsule Commonly known as:  TESSALON Take 1 capsule (100 mg total) by mouth 3 (three) times daily as needed.   cetirizine 10 MG tablet Commonly known as:  ZYRTEC Take 10 mg by mouth daily.   escitalopram 20 MG tablet Commonly known as:  LEXAPRO Take 1 tablet (20 mg total) by mouth daily.   MULTIVITAMIN PO Take 1 tablet by mouth daily.       Allergies: No Known Allergies  Past Medical History, Surgical history, Social history, and Family History were reviewed  and updated.  Review of Systems: All other 10 point review of systems is negative.   Physical Exam:  vitals were not taken for this visit.  Wt Readings from Last 3 Encounters:  07/17/16 210 lb (95.3 kg)  01/15/16 210 lb 2 oz (95.3 kg)  12/17/15 208 lb (94.3 kg)    Ocular: Sclerae unicteric, pupils equal, round and reactive to light Ear-nose-throat: Oropharynx clear, dentition fair Lymphatic: No cervical, supraclavicular or axillary adenopathy Lungs no rales or rhonchi, good excursion bilaterally Heart regular rate and rhythm, no murmur appreciated Abd soft, nontender, positive bowel sounds, no liver or spleen tip palpated on exam, no fluid wave  MSK no focal spinal tenderness, no joint edema Neuro: non-focal, well-oriented, appropriate affect Breasts: Deferred   Lab Results  Component Value Date   WBC 4.7 08/21/2015   HGB 16.7 08/21/2015   HCT 45.9 08/21/2015   MCV 86 08/21/2015   PLT 188 08/21/2015   No results found for: FERRITIN, IRON, TIBC, UIBC, IRONPCTSAT Lab Results  Component Value Date   RBC 5.31 08/21/2015   No results found for: KPAFRELGTCHN, LAMBDASER, KAPLAMBRATIO No results found for: IGGSERUM, IGA, IGMSERUM No results found for: Dorene Ar, A1GS, A2GS, Colin Benton, MSPIKE, SPEI   Chemistry      Component Value Date/Time   NA 141 08/21/2015 1135   K 4.0 08/21/2015 1135   CL 102 07/09/2014 1154  CO2 29 08/21/2015 1135   BUN 14.0 08/21/2015 1135   CREATININE 0.9 08/21/2015 1135      Component Value Date/Time   CALCIUM 10.1 08/21/2015 1135   ALKPHOS 100 08/21/2015 1135   AST 21 08/21/2015 1135   ALT 27 08/21/2015 1135   BILITOT 0.51 08/21/2015 1135      Impression and Plan: Dylan [name] is a very pleasant 31 yo caucasian male with type 1 Von Willebrand disease. He will be having his wisdom teeth removed in the next week or so. We will check his numbers today and then arrange DDAVP prior to his procedure. Once we get his  results we will scheduled his infusion and follow-up.  He is in agreement with the plan.  He will contact our office with any questions or concerns. We can certainly see him sooner if need be.   Verdie Mosher[place],Sean Malinowski M, NP 5/30/20188:45 AM

## 2016-10-15 LAB — VON WILLEBRAND PANEL
Factor VIII Activity: 77 % (ref 57–163)
VON WILLEBRAND AG: 52 % (ref 50–200)
vWF Activity: 45 % — ABNORMAL LOW (ref 50–200)

## 2016-10-15 LAB — APTT: aPTT: 31 s (ref 24–33)

## 2016-10-15 LAB — PROTHROMBIN TIME (PT)
INR: 1 (ref 0.8–1.2)
PROTHROMBIN TIME: 10.9 s (ref 9.1–12.0)

## 2016-10-16 LAB — LUPUS ANTICOAGULANT PANEL
DRVVT: 49.3 s — AB (ref 0.0–47.0)
PTT-LA: 49.4 s (ref 0.0–51.9)
dRVVT Mix: 40.6 s (ref 0.0–47.0)

## 2016-10-19 ENCOUNTER — Telehealth: Payer: Self-pay | Admitting: *Deleted

## 2016-10-19 NOTE — Telephone Encounter (Addendum)
Patient of results. He knows to schedule his procedure and then notify this office.   ----- Message from Verdie MosherSarah M [place], NP sent at 10/19/2016 10:07 AM EDT ----- Regarding: Dental procedure His Von Willebrand panel is looking ok. Have him schedule his procedure and lets us know date and time. He will need to have time to come here for DDAVP infusion the morning of and a day or two after. We will schedule his infusions once his surgery is scheduled. Let me know if there are any questions. Thank you!  Sarah  ----- Message ----- From: Interface, Lab In Three Zero One Sent: 10/14/2016   8:48 AM To: Verdie MosherSarah M [place], NP

## 2017-02-10 ENCOUNTER — Encounter: Payer: Self-pay | Admitting: Internal Medicine

## 2017-02-10 ENCOUNTER — Ambulatory Visit (INDEPENDENT_AMBULATORY_CARE_PROVIDER_SITE_OTHER): Payer: BLUE CROSS/BLUE SHIELD | Admitting: Internal Medicine

## 2017-02-10 VITALS — BP 114/64 | HR 64 | Temp 98.2°F | Resp 14 | Ht 75.0 in | Wt 209.0 lb

## 2017-02-10 DIAGNOSIS — Z0000 Encounter for general adult medical examination without abnormal findings: Secondary | ICD-10-CM

## 2017-02-10 DIAGNOSIS — Z23 Encounter for immunization: Secondary | ICD-10-CM

## 2017-02-10 DIAGNOSIS — Z09 Encounter for follow-up examination after completed treatment for conditions other than malignant neoplasm: Secondary | ICD-10-CM

## 2017-02-10 DIAGNOSIS — Z114 Encounter for screening for human immunodeficiency virus [HIV]: Secondary | ICD-10-CM

## 2017-02-10 NOTE — Assessment & Plan Note (Signed)
Tdap 2013; flu shot today -CCS: Never had a cscope, not indicated -Prostate cancer screening not indicated -doing well with diet and exercise -Labs repeated, check a FLP, TSH, HIV

## 2017-02-10 NOTE — Progress Notes (Signed)
Pre visit review using our clinic review tool, if applicable. No additional management support is needed unless otherwise documented below in the visit note. 

## 2017-02-10 NOTE — Progress Notes (Addendum)
Subjective:    Patient ID: Dylan [name], male    DOB: 14-Oct-1985, 31 y.o.   MRN: [id number]  DOS:  02/10/2017 Type of visit - description : cpx Interval history: Patient was taking Lexapro and then realized after doing some research on Internet that Lexapro may increase  bleeding so he stopped it. Did not have side effects or suicidal ideas while taking Lexapro and it did help anxiety.   Review of Systems Has usual easy bruising and nosebleeds but no problems with blood in the stool or in the urine.Symptoms not worse when she was on Lexapro  Other than above, a 14 point review of systems is negative     Past Medical History:  Diagnosis Date  . Anxiety   . Hypertension   . OSA (obstructive sleep apnea)    no cpap, couldn't tolerate  . Syncope   . Type 1 von Willebrand disease (HCC)     Past Surgical History:  Procedure Laterality Date  . SKIN BIOPSY  2004    Social History   Social History  . Marital status: Married    Spouse name: N/A  . Number of children: 2  . Years of education: N/A   Occupational History  . 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 Smoking status: Never Smoker  . Smokeless tobacco: Never Used  . Alcohol use Yes     Comment: rarely   . Drug use: No  . Sexual activity: Yes   Other Topics Concern  . Not on file   Social History Narrative   Married , 2 kids ~  2009, 2013     Family History  Problem Relation Age of Onset  . Diabetes Other        GM, dx early   . CAD Other        uncle at age 21  . Stroke Mother        Read [employer], uncle   . Colon cancer Neg Hx   . Prostate cancer Neg Hx      Allergies as of 02/10/2017   No Known Allergies     Medication List       Accurate as of 02/10/17 11:59 PM. Always use your most recent med list.          azelastine 0.1 % nasal spray Commonly known as:  ASTELIN Place 2 sprays into both nostrils at bedtime as needed for rhinitis. Use in each nostril as directed     cetirizine 10 MG tablet Commonly known as:  ZYRTEC Take 10 mg by mouth daily.   MULTIVITAMIN PO Take 1 tablet by mouth daily.            Discharge Care Instructions        Start     Ordered   02/10/17 0000  Lipid panel     02/10/17 1530   02/10/17 0000  TSH     02/10/17 1530   02/10/17 0000  HIV antibody (with reflex)     02/10/17 1530   02/10/17 0000  Flu Vaccine QUAD 6+ mos PF IM (Fluarix Quad PF)     02/10/17 1530         Objective:   Physical Exam BP 114/64 (BP Location: Left Arm, Patient Position: Sitting, Cuff Size: Small)   Pulse 64   Temp 98.2 F (36.8 C) (Oral)   Resp 14   Ht  (1.905 m)   Wt 209 lb (94.8 kg)  SpO2 96%   BMI 26.12 kg/m   General:   Well developed, well nourished . NAD.  Neck: No  thyromegaly  HEENT:  Normocephalic . Face symmetric, atraumatic Lungs:  CTA B Normal respiratory effort, no intercostal retractions, no accessory muscle use. Heart: RRR,  no murmur.  No pretibial edema bilaterally  Abdomen:  Not distended, soft, non-tender. No rebound or rigidity.   Skin: Exposed areas without rash. Not pale. Not jaundice Neurologic:  alert & oriented X3.  Speech normal, gait appropriate for age and unassisted Strength symmetric and appropriate for age.  Psych: Cognition and judgment appear intact.  Cooperative with normal attention span and concentration.  Behavior appropriate. No anxious or depressed appearing.    Assessment & Plan:   Assessment HTN OSA, CPAP intolerant Anxiety H/o recurrent  Syncope, DNKA w/ cards 2013 Type I Von Willebrand's disease-- sees Dr Myna Hidalgo, avoid ASA,  if surgery needed contact hematology for possibly DDAVP rx   PLAN HTN: On no meds, not an issue at this point OSA: Fatigue from time to time, gets sometimes a sleepy but not consistently so; intolerant to CPAP. Recommend to talk with his dentist to see about a mouth device; reassess the situation next year. Von Willebrand's disease:  Follow-up by hematology. Sxs at baseline Anxiety: Improved with Lexapro but the patient find out that lexapro may cause some bleeding so he stopped. I confirm that information in "up-to-date". Other SSRIs  also has similar warnings. Will discuss with hematology. Okay SSRIs?Marland Kitchen Addendum: Discuss with hem-onc, taking SSRIs is not a major red flag for them will restart SSRIs, see message. RTC one year

## 2017-02-10 NOTE — Patient Instructions (Signed)
  GO TO THE FRONT DESK  Schedule labs to be done this week , fasting  Schedule your next appointment for a  physical exam in one year  Talk to your dentist about a mouth guard for sleep apnea

## 2017-02-11 NOTE — Assessment & Plan Note (Addendum)
HTN: On no meds, not an issue at this point OSA: Fatigue from time to time, gets sometimes a sleepy but not consistently so; intolerant to CPAP. Recommend to talk with his dentist to see about a mouth device; reassess the situation next year. Von Willebrand's disease: Follow-up by hematology. Sxs at baseline Anxiety: Improved with Lexapro but the patient find out that lexapro may cause some bleeding so he stopped. I confirm that information in "up-to-date". Other SSRIs  also has similar warnings. Will discuss with hematology. Okay SSRIs?Marland Kitchen Addendum: Discuss with hem-onc, taking SSRIs is not a major red flag for them will restart SSRIs, see message. RTC one year

## 2017-02-12 ENCOUNTER — Encounter: Payer: Self-pay | Admitting: Internal Medicine

## 2017-02-12 MED ORDER — ESCITALOPRAM OXALATE 10 MG PO TABS: 10.0000 mg | ORAL_TABLET | Freq: Every day | ORAL | 5 refills | Status: DC

## 2017-02-12 NOTE — Telephone Encounter (Signed)
Send a prescription for Lexapro 10 mg 1 by mouth daily #30 and 5 refills  Received: Today  Message Contents  Wanda Plump, MD  Conrad Revere, CMA

## 2017-02-12 NOTE — Telephone Encounter (Signed)
Rx sent 

## 2018-01-10 DIAGNOSIS — Z3009 Encounter for other general counseling and advice on contraception: Secondary | ICD-10-CM | POA: Diagnosis not present

## 2018-01-16 HISTORY — PX: VASECTOMY: SHX75

## 2018-01-24 ENCOUNTER — Telehealth: Payer: Self-pay | Admitting: Hematology & Oncology

## 2018-01-24 NOTE — Telephone Encounter (Signed)
Returned call to pt per staff message re lab/follow up appt prior to surgery. Per patient he stated that the appointment is not needed and declined scheduling an apptiontment at this time.

## 2018-01-26 DIAGNOSIS — Z302 Encounter for sterilization: Secondary | ICD-10-CM | POA: Diagnosis not present

## 2018-02-16 ENCOUNTER — Encounter: Payer: BLUE CROSS/BLUE SHIELD | Admitting: Internal Medicine

## 2018-04-04 ENCOUNTER — Encounter: Payer: Self-pay | Admitting: Internal Medicine

## 2018-04-04 ENCOUNTER — Ambulatory Visit (INDEPENDENT_AMBULATORY_CARE_PROVIDER_SITE_OTHER): Payer: BLUE CROSS/BLUE SHIELD | Admitting: Internal Medicine

## 2018-04-04 VITALS — BP 128/80 | HR 88 | Temp 98.4°F | Resp 16 | Ht 74.0 in | Wt 217.5 lb

## 2018-04-04 DIAGNOSIS — D680 Von Willebrand's disease: Secondary | ICD-10-CM | POA: Diagnosis not present

## 2018-04-04 DIAGNOSIS — Z0000 Encounter for general adult medical examination without abnormal findings: Secondary | ICD-10-CM

## 2018-04-04 DIAGNOSIS — Z23 Encounter for immunization: Secondary | ICD-10-CM

## 2018-04-04 DIAGNOSIS — D6801 Von willebrand disease, type 1: Secondary | ICD-10-CM

## 2018-04-04 LAB — LIPID PANEL
CHOLESTEROL: 221 mg/dL — AB (ref 0–200)
HDL: 46.3 mg/dL (ref >39.00)
NonHDL: 174.71
TRIGLYCERIDES: 243 mg/dL — AB (ref 0.0–149.0)
Total CHOL/HDL Ratio: 5
VLDL: 48.6 mg/dL — ABNORMAL HIGH (ref 0.0–40.0)

## 2018-04-04 LAB — COMPREHENSIVE METABOLIC PANEL
ALT: 34 U/L (ref 0–53)
AST: 21 U/L (ref 0–37)
Albumin: 5 g/dL (ref 3.5–5.2)
Alkaline Phosphatase: 86 U/L (ref 39–117)
BUN: 14 mg/dL (ref 6–23)
CO2: 27 meq/L (ref 19–32)
Calcium: 9.8 mg/dL (ref 8.4–10.5)
Chloride: 100 mEq/L (ref 96–112)
Creatinine, Ser: 0.85 mg/dL (ref 0.40–1.50)
GFR: 109.52 mL/min (ref >60.00)
Glucose, Bld: 77 mg/dL (ref 70–99)
Potassium: 3.8 mEq/L (ref 3.5–5.1)
Sodium: 138 mEq/L (ref 135–145)
Total Bilirubin: 0.6 mg/dL (ref 0.2–1.2)
Total Protein: 7.7 g/dL (ref 6.0–8.3)

## 2018-04-04 LAB — CBC WITH DIFFERENTIAL/PLATELET
BASOS PCT: 0.5 % (ref 0.0–3.0)
Basophils Absolute: 0 10*3/uL (ref 0.0–0.1)
Eosinophils Absolute: 0.1 10*3/uL (ref 0.0–0.7)
Eosinophils Relative: 2.2 % (ref 0.0–5.0)
HCT: 45.3 % (ref 39.0–52.0)
Hemoglobin: 15.7 g/dL (ref 13.0–17.0)
LYMPHS ABS: 1.6 10*3/uL (ref 0.7–4.0)
Lymphocytes Relative: 29.6 % (ref 12.0–46.0)
MCHC: 34.7 g/dL (ref 30.0–36.0)
MCV: 89 fl (ref 78.0–100.0)
MONO ABS: 0.5 10*3/uL (ref 0.1–1.0)
MONOS PCT: 9.2 % (ref 3.0–12.0)
NEUTROS ABS: 3.2 10*3/uL (ref 1.4–7.7)
Neutrophils Relative %: 58.5 % (ref 43.0–77.0)
PLATELETS: 223 10*3/uL (ref 150.0–400.0)
RBC: 5.08 Mil/uL (ref 4.22–5.81)
RDW: 12.5 % (ref 11.5–15.5)
WBC: 5.5 10*3/uL (ref 4.0–10.5)

## 2018-04-04 LAB — TSH: TSH: 1.47 u[IU]/mL (ref 0.35–4.50)

## 2018-04-04 LAB — LDL CHOLESTEROL, DIRECT: Direct LDL: 158 mg/dL

## 2018-04-04 NOTE — Progress Notes (Signed)
Pre visit review using our clinic review tool, if applicable. No additional management support is needed unless otherwise documented below in the visit note. 

## 2018-04-04 NOTE — Patient Instructions (Signed)
GO TO THE LAB : Get the blood work     GO TO THE FRONT DESK Schedule your next appointment for a  Physical exam in 1 year 

## 2018-04-04 NOTE — Assessment & Plan Note (Addendum)
Tdap 2013; flu shot today -CCS: Never had a cscope, not indicated -Prostate cancer screening not indicated - Diet and exercise discussed  -Labs : cmp flp cbc tsh

## 2018-04-04 NOTE — Progress Notes (Signed)
Subjective:    Patient ID: Dylan [name], male    DOB: Dec 26, 1985, 32 y.o.   MRN: [id number]  DOS:  04/04/2018 Type of visit - description : cpx Feels well , no concerns    Review of Systems Developed a scratchy throat 2 days ago.  No fever, chills.  No itchy eyes or nose.  Sneezing a little.  Other than above, a 14 point review of systems is negative    Past Medical History:  Diagnosis Date  . Anxiety   . Hypertension   . OSA (obstructive sleep apnea)    no cpap, couldn't tolerate  . Syncope   . Type 1 von Willebrand disease (HCC)     Past Surgical History:  Procedure Laterality Date  . SKIN BIOPSY  2004  . VASECTOMY  01/2018    Social History   Socioeconomic History  . Marital status: Married    Spouse name: Not on file  . Number of children: 2  . Years of education: Not on file  . Highest education level: Not on file  Occupational History  . Occupation: Surveyor, minerals: [employer]  Social Needs  . Financial resource strain: Not on file  . Food insecurity:    Worry: Not on file    Inability: Not on file  . Transportation needs:    Medical: Not on file    Non-medical: Not on file  Tobacco Use  . Smoking status: Never Smoker  . Smokeless tobacco: Never Used  Substance and Sexual Activity  . Alcohol use: Yes    Comment: rarely   . Drug use: No  . Sexual activity: Yes  Lifestyle  . Physical activity:    Days per week: Not on file    Minutes per session: Not on file  . Stress: Not on file  Relationships  . Social connections:    Talks on phone: Not on file    Gets together: Not on file    Attends religious service: Not on file    Active member of club or organization: Not on file    Attends meetings of clubs or organizations: Not on file    Relationship status: Not on file  . Intimate partner violence:    Fear of current or ex partner: Not on file    Emotionally abused: Not on file    Physically abused: Not on file    Forced sexual  activity: Not on file  Other Topics Concern  . Not on file  Social History Narrative   Married , 2 kids ~  2009, 2013   Family History  Problem Relation Age of Onset  . Diabetes Other        GM, dx early   . CAD Other        uncle at age 42  . Stroke Mother        [employer], uncle   . Colon cancer Neg Hx   . Prostate cancer Neg Hx      Allergies as of 04/04/2018   No Known Allergies     Medication List        Accurate as of 04/04/18 11:59 PM. Always use your most recent med list.          azelastine 0.1 % nasal spray Commonly known as:  ASTELIN Place 2 sprays into both nostrils at bedtime as needed for rhinitis. Use in each nostril as directed   cetirizine 10 MG tablet Commonly known as:  ZYRTEC Take 10 mg by mouth daily.   MULTIVITAMIN PO Take 1 tablet by mouth daily.           Objective:   Physical Exam BP 128/80 (BP Location: Left Arm, Patient Position: Sitting, Cuff Size: Small)   Pulse 88   Temp 98.4 F (36.9 C) (Oral)   Resp 16   Ht 6\' 2"  (1.88 m)   Wt 217 lb 8 oz (98.7 kg)   SpO2 98%   BMI 27.93 kg/m  General: Well developed, NAD, BMI noted Neck: No  thyromegaly, no LAD's. HEENT:  Normocephalic . Face symmetric, atraumatic Lungs:  CTA B Normal respiratory effort, no intercostal retractions, no accessory muscle use. Heart: RRR,  no murmur.  No pretibial edema bilaterally  Abdomen:  Not distended, soft, non-tender. No rebound or rigidity.   Skin: Exposed areas without rash. Not pale. Not jaundice Neurologic:  alert & oriented X3.  Speech normal, gait appropriate for age and unassisted Strength symmetric and appropriate for age.  Psych: Cognition and judgment appear intact.  Cooperative with normal attention span and concentration.  Behavior appropriate. No anxious or depressed appearing.     Assessment & Plan:    Assessment HTN (?) OSA, CPAP intolerant Anxiety H/o recurrent  Syncope, DNKA w/ cards 2013 Type I Von Willebrand's  disease-- sees Dr Myna HidalgoEnnever, avoid ASA,  if surgery needed contact hematology for possibly DDAVP rx   PLAN HTN?  BP normal. Anxiety: She still works for the same company but at different location, stress has decreased significantly, not taking SSRIs Von Willebrand's disease: Asx, plans to see hematology as needed before procedures.  Recommend to get a bracelet or tag indicating his blood disease His dentist asked PCP to check his thyroid, neck exam is normal, we are checking a TSH. RTC 1 year

## 2018-04-05 NOTE — Assessment & Plan Note (Signed)
HTN?  BP normal. Anxiety: She still works for the same company but at different location, stress has decreased significantly, not taking SSRIs Von Willebrand's disease: Asx, plans to see hematology as needed before procedures.  Recommend to get a bracelet or tag indicating his blood disease His dentist asked PCP to check his thyroid, neck exam is normal, we are checking a TSH. RTC 1 year

## 2019-04-17 ENCOUNTER — Encounter: Payer: Self-pay | Admitting: Internal Medicine

## 2019-04-17 ENCOUNTER — Other Ambulatory Visit: Payer: Self-pay

## 2019-04-17 ENCOUNTER — Ambulatory Visit (INDEPENDENT_AMBULATORY_CARE_PROVIDER_SITE_OTHER): Payer: BC Managed Care – PPO | Admitting: Internal Medicine

## 2019-04-17 VITALS — BP 142/97 | HR 79 | Temp 97.6°F | Resp 16 | Ht 74.0 in | Wt 211.5 lb

## 2019-04-17 DIAGNOSIS — Z23 Encounter for immunization: Secondary | ICD-10-CM

## 2019-04-17 DIAGNOSIS — I10 Essential (primary) hypertension: Secondary | ICD-10-CM | POA: Diagnosis not present

## 2019-04-17 DIAGNOSIS — Z0000 Encounter for general adult medical examination without abnormal findings: Secondary | ICD-10-CM | POA: Diagnosis not present

## 2019-04-17 LAB — CBC WITH DIFFERENTIAL/PLATELET
Basophils Absolute: 0 10*3/uL (ref 0.0–0.1)
Basophils Relative: 0.6 % (ref 0.0–3.0)
Eosinophils Absolute: 0.1 10*3/uL (ref 0.0–0.7)
Eosinophils Relative: 2.4 % (ref 0.0–5.0)
HCT: 43.7 % (ref 39.0–52.0)
Hemoglobin: 15.2 g/dL (ref 13.0–17.0)
Lymphocytes Relative: 30.5 % (ref 12.0–46.0)
Lymphs Abs: 1.2 10*3/uL (ref 0.7–4.0)
MCHC: 34.7 g/dL (ref 30.0–36.0)
MCV: 89.3 fl (ref 78.0–100.0)
Monocytes Absolute: 0.3 10*3/uL (ref 0.1–1.0)
Monocytes Relative: 6.7 % (ref 3.0–12.0)
Neutro Abs: 2.3 10*3/uL (ref 1.4–7.7)
Neutrophils Relative %: 59.8 % (ref 43.0–77.0)
Platelets: 205 10*3/uL (ref 150.0–400.0)
RBC: 4.9 Mil/uL (ref 4.22–5.81)
RDW: 12.3 % (ref 11.5–15.5)
WBC: 3.9 10*3/uL — ABNORMAL LOW (ref 4.0–10.5)

## 2019-04-17 LAB — COMPREHENSIVE METABOLIC PANEL
ALT: 28 U/L (ref 0–53)
AST: 18 U/L (ref 0–37)
Albumin: 4.5 g/dL (ref 3.5–5.2)
Alkaline Phosphatase: 89 U/L (ref 39–117)
BUN: 15 mg/dL (ref 6–23)
CO2: 27 mEq/L (ref 19–32)
Calcium: 9.5 mg/dL (ref 8.4–10.5)
Chloride: 103 mEq/L (ref 96–112)
Creatinine, Ser: 0.83 mg/dL (ref 0.40–1.50)
GFR: 105.28 mL/min (ref >60.00)
Glucose, Bld: 88 mg/dL (ref 70–99)
Potassium: 4.1 mEq/L (ref 3.5–5.1)
Sodium: 139 mEq/L (ref 135–145)
Total Bilirubin: 0.5 mg/dL (ref 0.2–1.2)
Total Protein: 7.2 g/dL (ref 6.0–8.3)

## 2019-04-17 LAB — LIPID PANEL
Cholesterol: 218 mg/dL — ABNORMAL HIGH (ref 0–200)
HDL: 44.9 mg/dL (ref >39.00)
LDL Cholesterol: 143 mg/dL — ABNORMAL HIGH (ref 0–99)
NonHDL: 172.8
Total CHOL/HDL Ratio: 5
Triglycerides: 149 mg/dL (ref 0.0–149.0)
VLDL: 29.8 mg/dL (ref 0.0–40.0)

## 2019-04-17 MED ORDER — AMLODIPINE BESYLATE 5 MG PO TABS: 5.0000 mg | ORAL_TABLET | Freq: Every day | ORAL | 6 refills | Status: DC

## 2019-04-17 NOTE — Progress Notes (Addendum)
Subjective:    Patient ID: Dylan [name], male    DOB: 15-Jan-1986, 33 y.o.   MRN: [id number]  DOS:  04/17/2019 Type of visit - description: CPX No major concerns except his blood pressure, it has been elevated.  Review of Systems  Other than above, a 14 point review of systems is negative    Past Medical History:  Diagnosis Date  . Anxiety   . Hypertension   . OSA (obstructive sleep apnea)    no cpap, couldn't tolerate  . Syncope   . Type 1 von Willebrand disease ([place])     Past Surgical History:  Procedure Laterality Date  . SKIN BIOPSY  2004  . VASECTOMY  01/2018    Social History   Socioeconomic History  . Marital status: Married    Spouse name: Not on file  . Number of children: 2  . Years of education: Not on file  . Highest education level: Not on file  Occupational History  . Occupation: [employer]  . Financial resource strain: Not on file  . Food insecurity    Worry: Not on file    Inability: Not on file  . Transportation needs    Medical: Not on file    Non-medical: Not on file  Tobacco Use  . Smoking status: Never Smoker  . Smokeless tobacco: Never Used  Substance and Sexual Activity  . Alcohol use: Yes    Comment: rarely   . Drug use: No  . Sexual activity: Yes  Lifestyle  . Physical activity    Days per week: Not on file    Minutes per session: Not on file  . Stress: Not on file  Relationships  . Social Herbalist on phone: Not on file    Gets together: Not on file    Attends religious service: Not on file    Active member of club or organization: Not on file    Attends meetings of clubs or organizations: Not on file    Relationship status: Not on file  . Intimate partner violence    Fear of current or ex partner: Not on file    Emotionally abused: Not on file    Physically abused: Not on file    Forced sexual activity: Not on file  Other Topics Concern  . Not on file  Social  History Narrative   Married , 2 kids ~  2009, 2013     Family History  Problem Relation Age of Onset  . Diabetes Other        GM, dx early   . CAD Other        uncle at age 8  . Stroke Mother        Aggie Moats, uncle   . Diabetes Mother   . Diabetes Father   . Colon cancer Neg Hx   . Prostate cancer Neg Hx      Allergies as of 04/17/2019   No Known Allergies     Medication List       Accurate as of April 17, 2019 10:10 AM. If you have any questions, ask your nurse or doctor.        azelastine 0.1 % nasal spray Commonly known as: ASTELIN Place 2 sprays into both nostrils at bedtime as needed for rhinitis. Use in each nostril as directed   cetirizine 10 MG tablet Commonly known as: ZYRTEC Take 10 mg by  mouth daily.   MULTIVITAMIN PO Take 1 tablet by mouth daily.           Objective:   Physical Exam BP (!) 142/97 (BP Location: Left Arm, Patient Position: Sitting, Cuff Size: Normal)   Pulse 79   Temp 97.6 F (36.4 C) (Temporal)   Resp 16   Ht 6\' 2"  (1.88 m)   Wt 211 lb 8 oz (95.9 kg)   SpO2 100%   BMI 27.15 kg/m  General: Well developed, NAD, BMI noted Neck: No  thyromegaly  HEENT:  Normocephalic . Face symmetric, atraumatic Lungs:  CTA B Normal respiratory effort, no intercostal retractions, no accessory muscle use. Heart: RRR,  no murmur.  No pretibial edema bilaterally  Abdomen:  Not distended, soft, non-tender. No rebound or rigidity.   Skin: Exposed areas without rash. Not pale. Not jaundice Neurologic:  alert & oriented X3.  Speech normal, gait appropriate for age and unassisted Strength symmetric and appropriate for age.  Psych: Cognition and judgment appear intact.  Cooperative with normal attention span and concentration.  Behavior appropriate. No anxious or depressed appearing.     Assessment      Assessment HTN  OSA, CPAP intolerant Anxiety H/o recurrent  Syncope, DNKA w/ cards 2013 Type I Von Willebrand's disease-- sees  Dr 2014, avoid ASA,  if surgery needed contact hematology for possibly DDAVP rx   PLAN Here for CPX HTN: Not on any medications, BP at his dentist was 166/103, BP today 142/97.  Ambulatory BP mostly 130, 140s/90s. Based on the numbers, he has hypertension, Rx: Stay active, low-salt diet.  Start amlodipine 5 mg, monitor BPs, watch for side effects.  Check renin/aldosterone. EKG today: NSR, no acute changes RTC 6 months   This visit occurred during the SARS-CoV-2 public health emergency.  Safety protocols were in place, including screening questions prior to the visit, additional usage of staff PPE, and extensive cleaning of exam room while observing appropriate contact time as indicated for disinfecting solutions.

## 2019-04-17 NOTE — Patient Instructions (Addendum)
GO TO THE LAB : Get the blood work     GO TO THE FRONT DESK Schedule your next appointment for a checkup in 6 months  Start amlodipine 5 mg 1 tablet daily.   Check the  blood pressure weekly BP GOAL is between 110/65 and  135/85. If it is consistently higher or lower, let me know     DASH Eating Plan DASH stands for "Dietary Approaches to Stop Hypertension." The DASH eating plan is a healthy eating plan that has been shown to reduce high blood pressure (hypertension). It may also reduce your risk for type 2 diabetes, heart disease, and stroke. The DASH eating plan may also help with weight loss. What are tips for following this plan?  General guidelines  Avoid eating more than 2,300 mg (milligrams) of salt (sodium) a day. If you have hypertension, you may need to reduce your sodium intake to 1,500 mg a day.  Limit alcohol intake to no more than 1 drink a day for nonpregnant women and 2 drinks a day for men. One drink equals 12 oz of beer, 5 oz of wine, or 1 oz of hard liquor.  Work with your health care provider to maintain a healthy body weight or to lose weight. Ask what an ideal weight is for you.  Get at least 30 minutes of exercise that causes your heart to beat faster (aerobic exercise) most days of the week. Activities may include walking, swimming, or biking.  Work with your health care provider or diet and nutrition specialist (dietitian) to adjust your eating plan to your individual calorie needs. Reading food labels   Check food labels for the amount of sodium per serving. Choose foods with less than 5 percent of the Daily Value of sodium. Generally, foods with less than 300 mg of sodium per serving fit into this eating plan.  To find whole grains, look for the word "whole" as the first word in the ingredient list. Shopping  Buy products labeled as "low-sodium" or "no salt added."  Buy fresh foods. Avoid canned foods and premade or frozen meals. Cooking  Avoid  adding salt when cooking. Use salt-free seasonings or herbs instead of table salt or sea salt. Check with your health care provider or pharmacist before using salt substitutes.  Do not fry foods. Cook foods using healthy methods such as baking, boiling, grilling, and broiling instead.  Cook with heart-healthy oils, such as olive, canola, soybean, or sunflower oil. Meal planning  Eat a balanced diet that includes: ? 5 or more servings of fruits and vegetables each day. At each meal, try to fill half of your plate with fruits and vegetables. ? Up to 6-8 servings of whole grains each day. ? Less than 6 oz of lean meat, poultry, or fish each day. A 3-oz serving of meat is about the same size as a deck of cards. One egg equals 1 oz. ? 2 servings of low-fat dairy each day. ? A serving of nuts, seeds, or beans 5 times each week. ? Heart-healthy fats. Healthy fats called Omega-3 fatty acids are found in foods such as flaxseeds and coldwater fish, like sardines, salmon, and mackerel.  Limit how much you eat of the following: ? Canned or prepackaged foods. ? Food that is high in trans fat, such as fried foods. ? Food that is high in saturated fat, such as fatty meat. ? Sweets, desserts, sugary drinks, and other foods with added sugar. ? Full-fat dairy products.  Do  not salt foods before eating.  Try to eat at least 2 vegetarian meals each week.  Eat more home-cooked food and less restaurant, buffet, and fast food.  When eating at a restaurant, ask that your food be prepared with less salt or no salt, if possible. What foods are recommended? The items listed may not be a complete list. Talk with your dietitian about what dietary choices are best for you. Grains Whole-grain or whole-wheat bread. Whole-grain or whole-wheat pasta. Brown rice. Orpah Cobbatmeal. Quinoa. Bulgur. Whole-grain and low-sodium cereals. Pita bread. Low-fat, low-sodium crackers. Whole-wheat flour tortillas. Vegetables Fresh or  frozen vegetables (raw, steamed, roasted, or grilled). Low-sodium or reduced-sodium tomato and vegetable juice. Low-sodium or reduced-sodium tomato sauce and tomato paste. Low-sodium or reduced-sodium canned vegetables. Fruits All fresh, dried, or frozen fruit. Canned fruit in natural juice (without added sugar). Meat and other protein foods Skinless chicken or Malawiturkey. Ground chicken or [place]iturkey. Pork with fat trimmed off. Fish and seafood. Egg whites. Dried beans, peas, or lentils. Unsalted nuts, nut butters, and seeds. Unsalted canned beans. Lean cuts of beef with fat trimmed off. Low-sodium, lean deli meat. Dairy Low-fat (1%) or fat-free (skim) milk. Fat-free, low-fat, or reduced-fat cheeses. Nonfat, low-sodium ricotta or cottage cheese. Low-fat or nonfat yogurt. Low-fat, low-sodium cheese. Fats and oils Soft margarine without trans fats. Vegetable oil. Low-fat, reduced-fat, or light mayonnaise and salad dressings (reduced-sodium). Canola, safflower, olive, soybean, and sunflower oils. Avocado. Seasoning and other foods Herbs. Spices. Seasoning mixes without salt. Unsalted popcorn and pretzels. Fat-free sweets. What foods are not recommended? The items listed may not be a complete list. Talk with your dietitian about what dietary choices are best for you. Grains Baked goods made with fat, such as croissants, muffins, or some breads. Dry pasta or rice meal packs. Vegetables Creamed or fried vegetables. Vegetables in a cheese sauce. Regular canned vegetables (not low-sodium or reduced-sodium). Regular canned tomato sauce and paste (not low-sodium or reduced-sodium). Regular tomato and vegetable juice (not low-sodium or reduced-sodium). Rosita FirePickles. Olives. Fruits Canned fruit in a light or heavy syrup. Fried fruit. Fruit in cream or butter sauce. Meat and other protein foods Fatty cuts of meat. Ribs. Fried meat. Tomasa BlaseBacon. Sausage. Bologna and other processed lunch meats. Salami. Fatback. Hotdogs.  Bratwurst. Salted nuts and seeds. Canned beans with added salt. Canned or smoked fish. Whole eggs or egg yolks. Chicken or Malawiturkey with skin. Dairy Whole or 2% milk, cream, and half-and-half. Whole or full-fat cream cheese. Whole-fat or sweetened yogurt. Full-fat cheese. Nondairy creamers. Whipped toppings. Processed cheese and cheese spreads. Fats and oils Butter. Stick margarine. Lard. Shortening. Ghee. Bacon fat. Tropical oils, such as coconut, palm kernel, or palm oil. Seasoning and other foods Salted popcorn and pretzels. Onion salt, garlic salt, seasoned salt, table salt, and sea salt. Worcestershire sauce. Tartar sauce. Barbecue sauce. Teriyaki sauce. Soy sauce, including reduced-sodium. Steak sauce. Canned and packaged gravies. Fish sauce. Oyster sauce. Cocktail sauce. Horseradish that you find on the shelf. Ketchup. Mustard. Meat flavorings and tenderizers. Bouillon cubes. Hot sauce and Tabasco sauce. Premade or packaged marinades. Premade or packaged taco seasonings. Relishes. Regular salad dressings. Where to find more information:  National Heart, Lung, and Blood Institute: PopSteam.iswww.nhlbi.nih.gov  American Heart Association: www.heart.org Summary  The DASH eating plan is a healthy eating plan that has been shown to reduce high blood pressure (hypertension). It may also reduce your risk for type 2 diabetes, heart disease, and stroke.  With the DASH eating plan, you should limit salt (sodium) intake  to 2,300 mg a day. If you have hypertension, you may need to reduce your sodium intake to 1,500 mg a day.  When on the DASH eating plan, aim to eat more fresh fruits and vegetables, whole grains, lean proteins, low-fat dairy, and heart-healthy fats.  Work with your health care provider or diet and nutrition specialist (dietitian) to adjust your eating plan to your individual calorie needs. This information is not intended to replace advice given to you by your health care provider. Make sure you  discuss any questions you have with your health care provider. Document Released: 04/23/2011 Document Revised: 04/16/2017 Document Reviewed: 04/27/2016 Elsevier Patient Education  2020 [employer].

## 2019-04-17 NOTE — Progress Notes (Signed)
Pre visit review using our clinic review tool, if applicable. No additional management support is needed unless otherwise documented below in the visit note. 

## 2019-04-17 NOTE — Assessment & Plan Note (Signed)
Tdap 2013; flu shot today -CCS: Never had a cscope, not indicated -Prostate cancer screening not indicated - Diet and exercise discussed.  Specifically recommend low-salt/DASH diet. -Labs: CMP, FLP, CBC, renin aldosterone.

## 2019-04-18 NOTE — Assessment & Plan Note (Signed)
Here for CPX HTN: Not on any medications, BP at his dentist was 166/103, BP today 142/97.  Ambulatory BP mostly 130, 140s/90s. Based on the numbers, he has hypertension, Rx: Stay active, low-salt diet.  Start amlodipine 5 mg, monitor BPs, watch for side effects.  Check renin/aldosterone. EKG today: NSR, no acute changes RTC 6 months

## 2019-05-05 ENCOUNTER — Telehealth: Payer: Self-pay | Admitting: Internal Medicine

## 2019-05-05 NOTE — Telephone Encounter (Signed)
Copied from [place] [id number]. Topic: General - Other >> May 05, 2019  8:54 AM Berneta Levins wrote: Reason for CRM:  Pt states he has a person issue that he wants to speak with Dr. Ethel Rana nurse. Pt can be reached at [phone]

## 2019-05-08 NOTE — Telephone Encounter (Signed)
Pt returned call pls cb fu at [phone] pt wishes to speak to Dr Larose Kells a little more in detail.

## 2019-05-08 NOTE — Telephone Encounter (Signed)
Spoke w/ Pt- apologized for delay of call- he had vasectomy in 2019- has been having some issues since surgery- wanted to know if he needed to discuss w/ PCP or urology.

## 2019-05-08 NOTE — Telephone Encounter (Signed)
LMOM: If he has mild discomfort perhaps is scar tissue thus recommend to talk with his urologist If is something more serious such as swelling, severe pain he needs to be seen by me or the urologist ASAP. Encourage to call me back with questions or concerns

## 2019-05-09 MED ORDER — PAROXETINE HCL 10 MG PO TABS: 10.0000 mg | ORAL_TABLET | Freq: Every day | ORAL | 1 refills | Status: DC | PRN

## 2019-05-09 NOTE — Telephone Encounter (Signed)
Spoke with the patient, his main concern is actually premature ejaculation.  That is going on for a while but it seems to be slightly worse since the vasectomy. Denies ED, low libido, anxiety depression. Explained the patient techniques to regain control of ejaculation including early withdrawal before ejaculation. Will also prescribe Paxil 10 mg prior to sexual activity as needed. If above is not working he will let me know

## 2019-07-17 DIAGNOSIS — L814 Other melanin hyperpigmentation: Secondary | ICD-10-CM | POA: Diagnosis not present

## 2019-07-17 DIAGNOSIS — D485 Neoplasm of uncertain behavior of skin: Secondary | ICD-10-CM | POA: Diagnosis not present

## 2019-07-17 DIAGNOSIS — L918 Other hypertrophic disorders of the skin: Secondary | ICD-10-CM | POA: Diagnosis not present

## 2019-08-02 LAB — ALDOSTERONE + RENIN ACTIVITY W/O RATIO
ALDOSTERONE: 3.1 ng/dL (ref 0.0–30.0)
Renin: 1.739 ng/mL/hr (ref 0.167–5.380)

## 2019-10-19 ENCOUNTER — Ambulatory Visit: Payer: BC Managed Care – PPO | Admitting: Internal Medicine

## 2019-10-26 ENCOUNTER — Ambulatory Visit: Payer: BC Managed Care – PPO | Admitting: Internal Medicine

## 2019-10-26 ENCOUNTER — Encounter: Payer: Self-pay | Admitting: Internal Medicine

## 2019-10-26 ENCOUNTER — Other Ambulatory Visit: Payer: Self-pay

## 2019-10-26 VITALS — BP 133/87 | HR 98 | Temp 97.6°F | Resp 16 | Ht 74.0 in | Wt 209.2 lb

## 2019-10-26 DIAGNOSIS — F524 Premature ejaculation: Secondary | ICD-10-CM

## 2019-10-26 DIAGNOSIS — I10 Essential (primary) hypertension: Secondary | ICD-10-CM | POA: Diagnosis not present

## 2019-10-26 MED ORDER — AMLODIPINE BESYLATE 5 MG PO TABS: 5.0000 mg | ORAL_TABLET | Freq: Every day | ORAL | 6 refills | Status: DC

## 2019-10-26 NOTE — Progress Notes (Signed)
   Subjective:    Patient ID: Dylan [name], male    DOB: [birth year], 34 y.o.   MRN: [id number]  DOS:  10/26/2019 Type of visit - description: Follow-up Today we talk about hypertension, also premature ejaculation, see phone call from 05/02/2019.    Review of Systems In general feels well. No concerns or complaints. No chest pain, difficulty breathing or lower extremity edema  Past Medical History:  Diagnosis Date  . Anxiety   . Hypertension   . OSA (obstructive sleep apnea)    no cpap, couldn't tolerate  . Syncope   . Type 1 von Willebrand disease (HCC)     Past Surgical History:  Procedure Laterality Date  . SKIN BIOPSY  2004  . VASECTOMY  01/2018    Allergies as of 10/26/2019   No Known Allergies     Medication List       Accurate as of October 26, 2019 10:42 AM. If you have any questions, ask your nurse or doctor.        amLODipine 5 MG tablet Commonly known as: NORVASC Take 1 tablet (5 mg total) by mouth daily.   azelastine 0.1 % nasal spray Commonly known as: ASTELIN Place 2 sprays into both nostrils at bedtime as needed for rhinitis. Use in each nostril as directed   cetirizine 10 MG tablet Commonly known as: ZYRTEC Take 10 mg by mouth daily.   MULTIVITAMIN PO Take 1 tablet by mouth daily.   PARoxetine 10 MG tablet Commonly known as: PAXIL Take 1 tablet (10 mg total) by mouth daily as needed.          Objective:   Physical Exam BP 133/87 (BP Location: Left Arm, Patient Position: Sitting, Cuff Size: Normal)   Pulse 98   Temp 97.6 F (36.4 C) (Temporal)   Resp 16   Ht 6\' 2"  (1.88 m)   Wt 209 lb 4 oz (94.9 kg)   SpO2 100%   BMI 26.87 kg/m  General:   Well developed, NAD, BMI noted. HEENT:  Normocephalic . Face symmetric, atraumatic Lungs:  CTA B Normal respiratory effort, no intercostal retractions, no accessory muscle use. Heart: RRR,  no murmur.  Lower extremities: no pretibial edema bilaterally  Skin: Not pale. Not  jaundice Neurologic:  alert & oriented X3.  Speech normal, gait appropriate for age and unassisted Psych--  Cognition and judgment appear intact.  Cooperative with normal attention span and concentration.  Behavior appropriate. No anxious or depressed appearing.      Assessment    Assessment HTN  OSA, CPAP intolerant Anxiety H/o recurrent  Syncope, DNKA w/ cards 2013 Type I Von Willebrand's disease-- sees Dr 2014, avoid ASA,  if surgery needed contact hematology for possibly DDAVP rx   PLAN HTN: On amlodipine, no recent ambulatory BPs, blood pressure today is good, no apparent side effects.  Plan: Continue amlodipine, RF sent Premature ejaculation: See phone note from 05/05/2019, c/o premature ejaculation but no ED, low libido, anxiety or depression. Trial with Paxil did not work.  Different techniques discussed today with the patient, offered a referral to urology, declined for now. RTC by November 2021 CPX    This visit occurred during the SARS-CoV-2 public health emergency.  Safety protocols were in place, including screening questions prior to the visit, additional usage of staff PPE, and extensive cleaning of exam room while observing appropriate contact time as indicated for disinfecting solutions.

## 2019-10-26 NOTE — Progress Notes (Signed)
Pre visit review using our clinic review tool, if applicable. No additional management support is needed unless otherwise documented below in the visit note. 

## 2019-10-26 NOTE — Patient Instructions (Addendum)
  Check the  blood pressure   monthly   BP GOAL is between 110/65 and  135/85. If it is consistently higher or lower, let me know     GO TO THE FRONT DESK, PLEASE SCHEDULE YOUR APPOINTMENTS Come back for  a physical exam by 03/2020

## 2019-10-28 NOTE — Assessment & Plan Note (Signed)
HTN: On amlodipine, no recent ambulatory BPs, blood pressure today is good, no apparent side effects.  Plan: Continue amlodipine, RF sent Premature ejaculation: See phone note from 05/05/2019, c/o premature ejaculation but no ED, low libido, anxiety or depression. Trial with Paxil did not work.  Different techniques discussed today with the patient, offered a referral to urology, declined for now. RTC by November 2021 CPX

## 2019-11-06 ENCOUNTER — Encounter: Payer: Self-pay | Admitting: Internal Medicine

## 2019-11-07 MED ORDER — CARVEDILOL 12.5 MG PO TABS: 12.5000 mg | ORAL_TABLET | Freq: Two times a day (BID) | ORAL | 2 refills | Status: DC

## 2019-12-03 ENCOUNTER — Encounter: Payer: Self-pay | Admitting: Internal Medicine

## 2019-12-04 MED ORDER — CARVEDILOL 12.5 MG PO TABS: 12.5000 mg | ORAL_TABLET | Freq: Two times a day (BID) | ORAL | 1 refills | Status: DC

## 2019-12-07 ENCOUNTER — Ambulatory Visit: Payer: BC Managed Care – PPO | Admitting: Internal Medicine

## 2020-01-04 DIAGNOSIS — D485 Neoplasm of uncertain behavior of skin: Secondary | ICD-10-CM | POA: Diagnosis not present

## 2020-01-04 DIAGNOSIS — L910 Hypertrophic scar: Secondary | ICD-10-CM | POA: Diagnosis not present

## 2020-01-28 LAB — NOVEL CORONAVIRUS, NAA: SARS-CoV-2, NAA: DETECTED

## 2020-01-29 ENCOUNTER — Encounter: Payer: Self-pay | Admitting: Internal Medicine

## 2020-01-29 ENCOUNTER — Telehealth: Payer: Self-pay

## 2020-01-29 NOTE — Telephone Encounter (Signed)
Tested positive for COVID yesterday, lmom for monoclonal antibodies.

## 2020-01-30 ENCOUNTER — Telehealth: Payer: Self-pay | Admitting: Nurse Practitioner

## 2020-01-30 NOTE — Telephone Encounter (Signed)
Called patient to discuss Covid symptoms and the use of casirivimab/imdevimab, a monoclonal antibody infusion for those with mild to moderate Covid symptoms and at a high risk of hospitalization.  Pt is qualified for this infusion at the [facility] due to; Specific high risk criteria : BMI > 25; HTN   Message left to call back our hotline [phone].  Caela Huot, NP  

## 2020-03-07 ENCOUNTER — Encounter: Payer: Self-pay | Admitting: Internal Medicine

## 2020-04-02 ENCOUNTER — Encounter: Payer: BC Managed Care – PPO | Admitting: Internal Medicine

## 2020-04-17 ENCOUNTER — Ambulatory Visit (HOSPITAL_BASED_OUTPATIENT_CLINIC_OR_DEPARTMENT_OTHER)
Admission: RE | Admit: 2020-04-17 | Discharge: 2020-04-17 | Disposition: A | Discharge status: Home / Self Care | Payer: BC Managed Care – PPO | Source: Ambulatory Visit | Attending: Internal Medicine | Admitting: Internal Medicine

## 2020-04-17 ENCOUNTER — Ambulatory Visit (HOSPITAL_COMMUNITY): Payer: BC Managed Care – PPO

## 2020-04-17 ENCOUNTER — Ambulatory Visit (INDEPENDENT_AMBULATORY_CARE_PROVIDER_SITE_OTHER): Payer: BC Managed Care – PPO | Admitting: Internal Medicine

## 2020-04-17 ENCOUNTER — Encounter: Payer: Self-pay | Admitting: Internal Medicine

## 2020-04-17 ENCOUNTER — Other Ambulatory Visit: Payer: Self-pay

## 2020-04-17 VITALS — BP 130/79 | HR 74 | Temp 97.9°F | Ht 74.0 in | Wt 210.0 lb

## 2020-04-17 DIAGNOSIS — Z0000 Encounter for general adult medical examination without abnormal findings: Secondary | ICD-10-CM

## 2020-04-17 DIAGNOSIS — R059 Cough, unspecified: Secondary | ICD-10-CM

## 2020-04-17 DIAGNOSIS — I10 Essential (primary) hypertension: Secondary | ICD-10-CM | POA: Diagnosis not present

## 2020-04-17 DIAGNOSIS — Z23 Encounter for immunization: Secondary | ICD-10-CM | POA: Diagnosis not present

## 2020-04-17 NOTE — Progress Notes (Signed)
Subjective:    Patient ID: Dylan [name], male    DOB: May 27, 1985, 34 y.o.   MRN: [id number]  DOS:  04/17/2020 Type of visit - description: Physical exam In general feels well. He had COVID back in September 2021, he feels fully recuperated however he complains of cough. The cough actually proceeded COVID, is going on for months, on and off, at times intense creating nausea. GERD?  he occasionally has heartburn. He has a history of allergies, although has not noted PND he reports he is congested most of the time despite taking Zyrtec.    Review of Systems  Other than above, a 14 point review of systems is negative     Past Medical History:  Diagnosis Date  . Anxiety   . Hypertension   . OSA (obstructive sleep apnea)    no cpap, couldn't tolerate  . Syncope   . Type 1 von Willebrand disease (HCC)     Past Surgical History:  Procedure Laterality Date  . SKIN BIOPSY  2004  . VASECTOMY  01/2018    Allergies as of 04/17/2020   No Known Allergies     Medication List       Accurate as of April 17, 2020 11:59 PM. If you have any questions, ask your nurse or doctor.        carvedilol 12.5 MG tablet Commonly known as: COREG Take 1 tablet (12.5 mg total) by mouth 2 (two) times daily with a meal.   cetirizine 10 MG tablet Commonly known as: ZYRTEC Take 10 mg by mouth daily.   MULTIVITAMIN PO Take 1 tablet by mouth daily.          Objective:   Physical Exam BP 130/79 (BP Location: Right Arm, Patient Position: Sitting, Cuff Size: Large)   Pulse 74   Temp 97.9 F (36.6 C) (Oral)   Ht 6\' 2"  (1.88 m)   Wt 210 lb (95.3 kg)   SpO2 99%   BMI 26.96 kg/m  General: Well developed, NAD, BMI noted Neck: No  thyromegaly  HEENT:  Normocephalic . Face symmetric, atraumatic Lungs:  CTA B Normal respiratory effort, no intercostal retractions, no accessory muscle use. Heart: RRR,  no murmur.  Abdomen:  Not distended, soft, non-tender. No rebound or rigidity.    Lower extremities: no pretibial edema bilaterally  Skin: Exposed areas without rash. Not pale. Not jaundice Neurologic:  alert & oriented X3.  Speech normal, gait appropriate for age and unassisted Strength symmetric and appropriate for age.  Psych: Cognition and judgment appear intact.  Cooperative with normal attention span and concentration.  Behavior appropriate. No anxious or depressed appearing.     Assessment     Assessment HTN  OSA, CPAP intolerant Anxiety H/o recurrent  Syncope, DNKA w/ cards 2013 Type I Von Willebrand's disease-- sees Dr 2014, avoid ASA,  if surgery needed contact hematology for possibly DDAVP rx Covid cx 01/2020, had a infusion Sees derm , moles bx benign  PLAN Here for CPX HTN: Good compliance with carvedilol, BP today is okay, recommend to check at home. Type I von Willebrand disease: Occasionally nosebleeds, no other type of bleedings. Cough: Chronic issue, started actually before he was diagnosed with COVID 01-2020.  Could be related to postnasal dripping or GERD. Plan: Chest x-ray, Flonase, Protonix.  If not better in few months let me know.  Consider further evaluation. RTC 1 year   This visit occurred during the SARS-CoV-2 public health emergency.  Safety protocols were in  place, including screening questions prior to the visit, additional usage of staff PPE, and extensive cleaning of exam room while observing appropriate contact time as indicated for disinfecting solutions.

## 2020-04-17 NOTE — Patient Instructions (Signed)
Check the  blood pressure   BP GOAL is between 110/65 and  135/85. If it is consistently higher or lower, let me know  For cough:  Get Flonase over-the-counter: 2 sprays on each side of the nose every day  I am prescribing Protonix: 1 tablet before breakfast for 3 months  If the cough is not improving please let me know.  Please consider Covid vaccination 3 months after day infusion   GO TO THE LAB : Get the blood work     GO TO THE FRONT DESK, PLEASE SCHEDULE YOUR APPOINTMENTS Come back for a physical exam in 1 year      STOP BY THE FIRST FLOOR:  get the XR

## 2020-04-18 LAB — CBC WITH DIFFERENTIAL/PLATELET
Basophils Absolute: 0.1 10*3/uL (ref 0.0–0.1)
Basophils Relative: 1.3 % (ref 0.0–3.0)
Eosinophils Absolute: 0.1 10*3/uL (ref 0.0–0.7)
Eosinophils Relative: 2.9 % (ref 0.0–5.0)
HCT: 46.6 % (ref 39.0–52.0)
Hemoglobin: 16 g/dL (ref 13.0–17.0)
Lymphocytes Relative: 27.9 % (ref 12.0–46.0)
Lymphs Abs: 1.3 10*3/uL (ref 0.7–4.0)
MCHC: 34.4 g/dL (ref 30.0–36.0)
MCV: 89.7 fl (ref 78.0–100.0)
Monocytes Absolute: 0.5 10*3/uL (ref 0.1–1.0)
Monocytes Relative: 10.1 % (ref 3.0–12.0)
Neutro Abs: 2.8 10*3/uL (ref 1.4–7.7)
Neutrophils Relative %: 57.8 % (ref 43.0–77.0)
Platelets: 216 10*3/uL (ref 150.0–400.0)
RBC: 5.2 Mil/uL (ref 4.22–5.81)
RDW: 12.9 % (ref 11.5–15.5)
WBC: 4.8 10*3/uL (ref 4.0–10.5)

## 2020-04-18 LAB — COMPREHENSIVE METABOLIC PANEL
ALT: 28 U/L (ref 0–53)
AST: 20 U/L (ref 0–37)
Albumin: 5.2 g/dL (ref 3.5–5.2)
Alkaline Phosphatase: 89 U/L (ref 39–117)
BUN: 18 mg/dL (ref 6–23)
CO2: 29 mEq/L (ref 19–32)
Calcium: 10.4 mg/dL (ref 8.4–10.5)
Chloride: 101 mEq/L (ref 96–112)
Creatinine, Ser: 0.95 mg/dL (ref 0.40–1.50)
GFR: 103.15 mL/min (ref >60.00)
Glucose, Bld: 70 mg/dL (ref 70–99)
Potassium: 4.3 mEq/L (ref 3.5–5.1)
Sodium: 141 mEq/L (ref 135–145)
Total Bilirubin: 0.4 mg/dL (ref 0.2–1.2)
Total Protein: 8.1 g/dL (ref 6.0–8.3)

## 2020-04-18 LAB — LIPID PANEL
Cholesterol: 244 mg/dL — ABNORMAL HIGH (ref 0–200)
HDL: 60.8 mg/dL (ref >39.00)
NonHDL: 183.11
Total CHOL/HDL Ratio: 4
Triglycerides: 211 mg/dL — ABNORMAL HIGH (ref 0.0–149.0)
VLDL: 42.2 mg/dL — ABNORMAL HIGH (ref 0.0–40.0)

## 2020-04-18 LAB — LDL CHOLESTEROL, DIRECT: Direct LDL: 156 mg/dL

## 2020-04-18 LAB — TSH: TSH: 1.07 u[IU]/mL (ref 0.35–4.50)

## 2020-04-20 ENCOUNTER — Encounter: Payer: Self-pay | Admitting: Internal Medicine

## 2020-04-20 NOTE — Assessment & Plan Note (Signed)
-   Tdap 2013; -  flu shot today -Recommend COVID vaccine 3 months after infusion for Covid infection. -CCS: Never had a cscope, not indicated -Prostate cancer screening not indicated - Diet and exercise discussed.   -Labs: CMP, FLP, CBC, TSH.

## 2020-04-20 NOTE — Assessment & Plan Note (Signed)
Here for CPX HTN: Good compliance with carvedilol, BP today is okay, recommend to check at home. Type I von Willebrand disease: Occasionally nosebleeds, no other type of bleedings. Cough: Chronic issue, started actually before he was diagnosed with COVID 01-2020.  Could be related to postnasal dripping or GERD. Plan: Chest x-ray, Flonase, Protonix.  If not better in few months let me know.  Consider further evaluation. RTC 1 year

## 2020-08-27 ENCOUNTER — Other Ambulatory Visit: Payer: Self-pay | Admitting: Family

## 2020-08-27 DIAGNOSIS — D6801 Von willebrand disease, type 1: Secondary | ICD-10-CM

## 2020-08-27 DIAGNOSIS — D680 Von Willebrand's disease: Secondary | ICD-10-CM

## 2020-08-27 DIAGNOSIS — R760 Raised antibody titer: Secondary | ICD-10-CM

## 2020-08-28 ENCOUNTER — Encounter: Payer: Self-pay | Admitting: Internal Medicine

## 2020-08-28 ENCOUNTER — Inpatient Hospital Stay (HOSPITAL_BASED_OUTPATIENT_CLINIC_OR_DEPARTMENT_OTHER): Payer: BC Managed Care – PPO | Admitting: Family

## 2020-08-28 ENCOUNTER — Other Ambulatory Visit: Payer: Self-pay

## 2020-08-28 ENCOUNTER — Telehealth: Payer: Self-pay | Admitting: *Deleted

## 2020-08-28 ENCOUNTER — Inpatient Hospital Stay: Payer: BC Managed Care – PPO | Attending: Family

## 2020-08-28 VITALS — HR 60 | Temp 98.9°F | Resp 18 | Ht 74.0 in | Wt 211.0 lb

## 2020-08-28 DIAGNOSIS — D6801 Von willebrand disease, type 1: Secondary | ICD-10-CM

## 2020-08-28 DIAGNOSIS — D680 Von Willebrand's disease: Secondary | ICD-10-CM

## 2020-08-28 DIAGNOSIS — R760 Raised antibody titer: Secondary | ICD-10-CM

## 2020-08-28 LAB — CBC WITH DIFFERENTIAL (CANCER CENTER ONLY)
Abs Immature Granulocytes: 0.01 10*3/uL (ref 0.00–0.07)
Basophils Absolute: 0 10*3/uL (ref 0.0–0.1)
Basophils Relative: 0 %
Eosinophils Absolute: 0.1 10*3/uL (ref 0.0–0.5)
Eosinophils Relative: 2 %
HCT: 44.1 % (ref 39.0–52.0)
Hemoglobin: 15.5 g/dL (ref 13.0–17.0)
Immature Granulocytes: 0 %
Lymphocytes Relative: 29 %
Lymphs Abs: 1.4 10*3/uL (ref 0.7–4.0)
MCH: 31 pg (ref 26.0–34.0)
MCHC: 35.1 g/dL (ref 30.0–36.0)
MCV: 88.2 fL (ref 80.0–100.0)
Monocytes Absolute: 0.4 10*3/uL (ref 0.1–1.0)
Monocytes Relative: 9 %
Neutro Abs: 2.8 10*3/uL (ref 1.7–7.7)
Neutrophils Relative %: 60 %
Platelet Count: 220 10*3/uL (ref 150–400)
RBC: 5 MIL/uL (ref 4.22–5.81)
RDW: 11.7 % (ref 11.5–15.5)
WBC Count: 4.7 10*3/uL (ref 4.0–10.5)
nRBC: 0 % (ref 0.0–0.2)

## 2020-08-28 LAB — CMP (CANCER CENTER ONLY)
ALT: 22 U/L (ref 0–44)
AST: 17 U/L (ref 15–41)
Albumin: 4.9 g/dL (ref 3.5–5.0)
Alkaline Phosphatase: 81 U/L (ref 38–126)
Anion gap: 8 (ref 5–15)
BUN: 20 mg/dL (ref 6–20)
CO2: 26 mmol/L (ref 22–32)
Calcium: 9.9 mg/dL (ref 8.9–10.3)
Chloride: 105 mmol/L (ref 98–111)
Creatinine: 1.06 mg/dL (ref 0.61–1.24)
GFR, Estimated: >60 mL/min (ref >60)
Glucose, Bld: 88 mg/dL (ref 70–99)
Potassium: 4.4 mmol/L (ref 3.5–5.1)
Sodium: 139 mmol/L (ref 135–145)
Total Bilirubin: 0.4 mg/dL (ref 0.3–1.2)
Total Protein: 7.4 g/dL (ref 6.5–8.1)

## 2020-08-28 LAB — PROTIME-INR
INR: 1 (ref 0.8–1.2)
Prothrombin Time: 13.2 seconds (ref 11.4–15.2)

## 2020-08-28 LAB — APTT: aPTT: 33 seconds (ref 24–36)

## 2020-08-28 NOTE — Progress Notes (Signed)
Hematology and Oncology Follow Up Visit  Dylan [name] [id number] 03/03/1986 35 y.o. 08/28/2020   Principle Diagnosis:  Type 1 Von Willebrand disease  Current Therapy:        Observation   Interim History:  Dylan [name] is here today for follow-up. He was scheduled to have his wisdom teeth extracted this week but will reschedule for after we have his lab results. VWB panel is pending at this time.  He has had no issues with abnormal blood loss. No bruising or petechiae.  He states that he has terrible seasonal allergies and sinus infections. He will occasionally have a little blood on his tissue with irritation.  He states that he keeps a dry cough and his PCP has worked him up for GERD.  No fever, chills, n/v, rash, dizziness, SOB, chest pain, palpitations, abdominal pain or changes in bowel or bladder habits.  No swelling, numbness or tingling in his extremities at this time.  He sometimes has pain and swelling in the right knee if he strains it. He wears a brace as needed.  No falls or syncope to report.  He has maintained a good appetite and is staying well hydrated. His weight is stable at 211 lbs.  BP on retake was 130/96. He states that he did take his Coreg today as prescribed. No headaches, dizziness or changes in vision.   ECOG Performance Status: 0 - Asymptomatic  Medications:  Allergies as of 08/28/2020   No Known Allergies     Medication List       Accurate as of August 28, 2020  1:13 PM. If you have any questions, ask your nurse or doctor.        carvedilol 12.5 MG tablet Commonly known as: COREG Take 1 tablet (12.5 mg total) by mouth 2 (two) times daily with a meal.   cetirizine 10 MG tablet Commonly known as: ZYRTEC Take 10 mg by mouth daily.   MULTIVITAMIN PO Take 1 tablet by mouth daily.       Allergies: No Known Allergies  Past Medical History, Surgical history, Social history, and Family History were reviewed and updated.  Review of  Systems: All other 10 point review of systems is negative.   Physical Exam:  vitals were not taken for this visit.   Wt Readings from Last 3 Encounters:  04/17/20 210 lb (95.3 kg)  10/26/19 209 lb 4 oz (94.9 kg)  04/17/19 211 lb 8 oz (95.9 kg)    Ocular: Sclerae unicteric, pupils equal, round and reactive to light Ear-nose-throat: Oropharynx clear, dentition fair Lymphatic: No cervical or supraclavicular adenopathy Lungs no rales or rhonchi, good excursion bilaterally Heart regular rate and rhythm, no murmur appreciated Abd soft, nontender, positive bowel sounds MSK no focal spinal tenderness, no joint edema Neuro: non-focal, well-oriented, appropriate affect Breasts: Deferred   Lab Results  Component Value Date   WBC 4.7 08/28/2020   HGB 15.5 08/28/2020   HCT 44.1 08/28/2020   MCV 88.2 08/28/2020   PLT 220 08/28/2020   No results found for: FERRITIN, IRON, TIBC, UIBC, IRONPCTSAT Lab Results  Component Value Date   RBC 5.00 08/28/2020   No results found for: KPAFRELGTCHN, LAMBDASER, KAPLAMBRATIO No results found for: IGGSERUM, IGA, IGMSERUM No results found for: Dorene Ar, A1GS, A2GS, Colin Benton, MSPIKE, SPEI   Chemistry      Component Value Date/Time   NA 141 04/17/2020 1609   NA 142 10/14/2016 0829   K 4.3 04/17/2020 1609   K  3.8 10/14/2016 0829   CL 101 04/17/2020 1609   CO2 29 04/17/2020 1609   CO2 26 10/14/2016 0829   BUN 18 04/17/2020 1609   BUN 14.2 10/14/2016 0829   CREATININE 0.95 04/17/2020 1609   CREATININE 1.0 10/14/2016 0829      Component Value Date/Time   CALCIUM 10.4 04/17/2020 1609   CALCIUM 9.9 10/14/2016 0829   ALKPHOS 89 04/17/2020 1609   ALKPHOS 104 10/14/2016 0829   AST 20 04/17/2020 1609   AST 21 10/14/2016 0829   ALT 28 04/17/2020 1609   ALT 30 10/14/2016 0829   BILITOT 0.4 04/17/2020 1609   BILITOT 0.53 10/14/2016 0829       Impression and Plan: Dylan [name] is a very pleasant 35 yo caucasian male  with type 1 Von Willebrand disease with positive lupus anticoagulant in the past. He needs to have the rest of his wisdom teeth extracted. We will check his counts and then arrange DDAVP prior to his procedure. VWB panel and lupus anticoagulant are pending. PTT and INR are within normal limits.  He will continue to follow-up as needed.  He can contact our office with any questions or concerns.   Emeline Gins, NP 4/13/20221:13 PM

## 2020-08-28 NOTE — Telephone Encounter (Signed)
Per 08/28/20 los Follow-ups PRN

## 2020-08-29 LAB — DRVVT CONFIRM: dRVVT Confirm: 1.5 ratio — ABNORMAL HIGH (ref 0.8–1.2)

## 2020-08-29 LAB — LUPUS ANTICOAGULANT PANEL
DRVVT: 52 s — ABNORMAL HIGH (ref 0.0–47.0)
PTT Lupus Anticoagulant: 45.4 s (ref 0.0–51.9)

## 2020-08-29 LAB — DRVVT MIX: dRVVT Mix: 41 s — ABNORMAL HIGH (ref 0.0–40.4)

## 2020-08-30 ENCOUNTER — Telehealth: Payer: Self-pay | Admitting: *Deleted

## 2020-08-30 LAB — VON WILLEBRAND PANEL
Coagulation Factor VIII: 93 % (ref 56–140)
Ristocetin Co-factor, Plasma: 38 % — ABNORMAL LOW (ref 50–200)
Von Willebrand Antigen, Plasma: 54 % (ref 50–200)

## 2020-08-30 LAB — COAG STUDIES INTERP REPORT

## 2020-08-30 NOTE — Telephone Encounter (Signed)
-----   Message from Verdie Mosher, NP sent at 08/30/2020  4:01 PM EDT ----- We will give DDAVP the day of his wisdom teeth extraction a few hours before. He has had this before. We just need to know a week prior to his procedure so Misty Stanley can get the medication. Thank you!   ----- Message ----- From: Interface, Lab In [place] Sent: 08/28/2020   1:10 PM EDT To: Verdie Mosher, NP

## 2020-08-30 NOTE — Telephone Encounter (Signed)
Called pt.  pt verbalized he will call office when extraction is scheduled. No further concerns

## 2020-09-05 ENCOUNTER — Telehealth: Payer: Self-pay

## 2020-09-05 NOTE — Telephone Encounter (Signed)
Pt called in to sch his inf prior to tooth extraction, see prior notes if needed   Dylan [name]

## 2020-09-13 ENCOUNTER — Ambulatory Visit: Payer: BC Managed Care – PPO | Admitting: Internal Medicine

## 2020-09-13 ENCOUNTER — Other Ambulatory Visit: Payer: Self-pay

## 2020-09-13 ENCOUNTER — Encounter: Payer: Self-pay | Admitting: Internal Medicine

## 2020-09-13 VITALS — BP 136/94 | HR 85 | Temp 98.3°F | Resp 16 | Ht 74.0 in | Wt 207.0 lb

## 2020-09-13 DIAGNOSIS — I10 Essential (primary) hypertension: Secondary | ICD-10-CM

## 2020-09-13 MED ORDER — CARVEDILOL 25 MG PO TABS: 25.0000 mg | ORAL_TABLET | Freq: Two times a day (BID) | ORAL | 2 refills | Status: DC

## 2020-09-13 NOTE — Progress Notes (Signed)
Subjective:    Patient ID: Dylan [name], male    DOB: [birth year], 35 y.o.   MRN: [id number]  DOS:  09/13/2020 Type of visit - description: Acute Here for BP management  Recently his BP was elevated at hematology. He continue checking, log reviewed.  He is asymptomatic, denies chest pain no difficulty breathing No headaches. Since then, he is trying to eat much healthier with lower salt   BP Readings from Last 3 Encounters:  09/13/20 (!) 136/94  04/17/20 130/79  10/26/19 133/87     Review of Systems See above   Past Medical History:  Diagnosis Date  . Anxiety   . Hypertension   . OSA (obstructive sleep apnea)    no cpap, couldn't tolerate  . Syncope   . Type 1 von Willebrand disease (HCC)     Past Surgical History:  Procedure Laterality Date  . SKIN BIOPSY  2004  . VASECTOMY  01/2018    Allergies as of 09/13/2020   No Known Allergies     Medication List       Accurate as of September 13, 2020 11:59 PM. If you have any questions, ask your nurse or doctor.        carvedilol 25 MG tablet Commonly known as: COREG Take 1 tablet (25 mg total) by mouth 2 (two) times daily with a meal. What changed:   medication strength  how much to take Changed by: Willow Ora, MD   cetirizine 10 MG tablet Commonly known as: ZYRTEC Take 10 mg by mouth daily.   MULTIVITAMIN PO Take 1 tablet by mouth daily.          Objective:   Physical Exam BP (!) 136/94 (BP Location: Left Arm, Patient Position: Sitting, Cuff Size: Normal)   Pulse 85   Temp 98.3 F (36.8 C) (Oral)   Resp 16   Ht 6\' 2"  (1.88 m)   Wt 207 lb (93.9 kg)   SpO2 98%   BMI 26.58 kg/m  General:   Well developed, NAD, BMI noted. HEENT:  Normocephalic . Face symmetric, atraumatic Lungs:  CTA B Normal respiratory effort, no intercostal retractions, no accessory muscle use. Heart: RRR,  no murmur.  Lower extremities: no pretibial edema bilaterally  Skin: Not pale. Not jaundice Neurologic:   alert & oriented X3.  Speech normal, gait appropriate for age and unassisted Psych--  Cognition and judgment appear intact.  Cooperative with normal attention span and concentration.  Behavior appropriate. No anxious or depressed appearing.      Assessment       Assessment HTN  OSA, CPAP intolerant Anxiety H/o recurrent  Syncope, DNKA w/ cards 2013 Type I Von Willebrand's disease-- sees Dr 2014, avoid ASA,  if surgery needed contact hematology for possibly DDAVP rx Covid cx 01/2020, had a infusion Sees derm , moles bx benign  PLAN HTN  not controlled: Went to hematology about 2 weeks ago, BP was 150/102, 132/96, today is also slightly elevated at 136/94. Ambulatory BPs mostly in the 130s over 80s. No symptoms, last BMP satisfactory. Will attempt to get slightly better control of BP: Continue with a low-salt diet, remain active, increase carvedilol to 25 mg twice daily, continue monitoring BPs, goal to have it less than 125/80 at all times. If he feels the medication is too strong or he has side effects he will let me know. Otherwise CPX in December   This visit occurred during the SARS-CoV-2 public health emergency.  Safety protocols were in  place, including screening questions prior to the visit, additional usage of staff PPE, and extensive cleaning of exam room while observing appropriate contact time as indicated for disinfecting solutions.

## 2020-09-13 NOTE — Patient Instructions (Addendum)
Increase carvedilol to 25 mg twice a day  Continue checking your blood pressures regularly BP GOAL is between 110/65 and  125/80. If it is consistently higher or lower, let me know    HOW TO TAKE YOUR BLOOD PRESSURE:   Rest 5 minutes before taking your blood pressure.   Don't smoke or drink caffeinated beverages for at least 30 minutes before.   Take your blood pressure before (not after) you eat.   Sit comfortably with your back supported and both feet on the floor (don't cross your legs).   Elevate your arm to heart level on a table or a desk.   Use the proper sized cuff. It should fit smoothly and snugly around your bare upper arm. There should be enough room to slip a fingertip under the cuff. The bottom edge of the cuff should be 1 inch above the crease of the elbow.   Ideally, take 3 measurements at one sitting and record the average.   See you in December, sooner if needed

## 2020-09-14 NOTE — Assessment & Plan Note (Signed)
HTN  not controlled: Went to hematology about 2 weeks ago, BP was 150/102, 132/96, today is also slightly elevated at 136/94. Ambulatory BPs mostly in the 130s over 80s. No symptoms, last BMP satisfactory. Will attempt to get slightly better control of BP: Continue with a low-salt diet, remain active, increase carvedilol to 25 mg twice daily, continue monitoring BPs, goal to have it less than 125/80 at all times. If he feels the medication is too strong or he has side effects he will let me know. Otherwise CPX in December

## 2020-09-19 ENCOUNTER — Inpatient Hospital Stay: Payer: BC Managed Care – PPO | Attending: Family

## 2020-09-19 ENCOUNTER — Other Ambulatory Visit: Payer: Self-pay

## 2020-09-19 VITALS — BP 143/97 | HR 74 | Temp 98.0°F | Resp 16

## 2020-09-19 DIAGNOSIS — D680 Von Willebrand's disease: Secondary | ICD-10-CM | POA: Diagnosis not present

## 2020-09-19 DIAGNOSIS — D6801 Von willebrand disease, type 1: Secondary | ICD-10-CM

## 2020-09-19 MED ORDER — SODIUM CHLORIDE 0.9 % IV SOLN
Freq: Once | INTRAVENOUS | Status: AC
Start: 2020-09-19 — End: 2020-09-19
  Filled 2020-09-19: qty 250

## 2020-09-19 MED ORDER — DESMOPRESSIN ACETATE 4 MCG/ML IJ SOLN
0.3000 ug/kg | Freq: Once | INTRAMUSCULAR | Status: AC
  Administered 2020-09-19: 28 ug via INTRAVENOUS
  Filled 2020-09-19: qty 7

## 2020-09-19 NOTE — Patient Instructions (Signed)
Desmopressin injection What is this medicine? DESMOPRESSIN (des moe PRESS in) is a man made hormone. It is used to treat or prevent bleeding in patients with Hemophilia A and von Willebrand's Disease. This medicine is also used to reduce urine production in patients with diabetes insipidus. This medicine may be used for other purposes; ask your health care provider or pharmacist if you have questions. COMMON BRAND NAME(S): DDAVP What should I tell my health care provider before I take this medicine? They need to know if you have any of these conditions:  blood clot in the past  cystic fibrosis  heart disease  high blood pressure  kidney disease  low levels of sodium in the blood  an unusual or allergic reaction to desmopressin, vasopressin, other medicines, foods, dyes, or preservatives  pregnant or trying to get pregnant  breast-feeding How should I use this medicine? This medicine is infused into a vein or injected under the skin. It is usually given by a health care professional in a hospital or clinic setting. If you use this medicine at home, you will be taught how to prepare and give this medicine. Use exactly as directed. Take your medicine at regular intervals. Do not take your medicine more often than directed. It is important that you put your used needles and syringes in a special sharps container. Do not put them in a trash can. If you do not have a sharps container, call your pharmacist or healthcare provider to get one. Talk to your pediatrician regarding the use of this medicine in children. While this drug may be prescribed for selected conditions, precautions do apply. Overdosage: If you think you have taken too much of this medicine contact a poison control center or emergency room at once. NOTE: This medicine is only for you. Do not share this medicine with others. What if I miss a dose? If you miss a dose and it is almost time for your next dose, take only that  dose. Do not take double or extra doses. What may interact with this medicine?  alcohol  demeclocycline  medicines for asthma, breathing problems, colds  medicines for low blood pressure This list may not describe all possible interactions. Give your health care provider a list of all the medicines, herbs, non-prescription drugs, or dietary supplements you use. Also tell them if you smoke, drink alcohol, or use illegal drugs. Some items may interact with your medicine. What should I watch for while using this medicine? Visit your doctor or health care professional for regular check ups. You may need to get some lab tests done while taking this medicine. Talk with your doctor about how many glasses of water or other fluids you need to drink a day. Only drink enough fluid to satisfy your thirst or as directed. Too much or not enough water can cause harm. Call your doctor or health care professional for advice if you get sick and have a stomach or intestinal virus with vomiting or diarrhea or if you have an infection or fever. What side effects may I notice from receiving this medicine? Side effects that you should report to your doctor or health care professional as soon as possible:  allergic reactions like skin rash, itching or hives, swelling of the face, lips, or tongue  breathing problems  chest pain, tightness  change in blood pressure  fast, irregular heart rate  signs and symptoms of low sodium like headache; drowsiness; confusion; nausea and vomiting; muscle cramps; restless; or seizures    sudden weight gain  swelling of the legs or ankles  unusual bleeding, bruising  unusually weak or tired Side effects that usually do not require medical attention (report to your doctor or health care professional if they continue or are bothersome):  flushing, reddening of the skin  headache  mild nausea  pain, redness where injected  stomach cramps This list may not describe  all possible side effects. Call your doctor for medical advice about side effects. You may report side effects to FDA at 1-800-FDA-1088. Where should I keep my medicine? Keep out of the reach of children. You will be instructed on how to store this medicine. Throw away any unused medicine after the expiration date on the label. NOTE: This sheet is a summary. It may not cover all possible information. If you have questions about this medicine, talk to your doctor, pharmacist, or health care provider.  2021 Elsevier/Gold Standard (2015-07-26 12:23:16)

## 2020-12-17 DIAGNOSIS — L578 Other skin changes due to chronic exposure to nonionizing radiation: Secondary | ICD-10-CM | POA: Diagnosis not present

## 2020-12-17 DIAGNOSIS — D485 Neoplasm of uncertain behavior of skin: Secondary | ICD-10-CM | POA: Diagnosis not present

## 2021-02-25 ENCOUNTER — Telehealth: Payer: Self-pay

## 2021-02-25 NOTE — Telephone Encounter (Signed)
Fax received from [facility] oral surgery requesting clearance for patient to have wisdom teeth removed, patient has not had recent labs drawn, per MD pateint needs to come in for lab work and follow up for clearance prior to surgery, called oral surgery office at [phone] and informed them and faxed back clearance form with this information to there office and will follow up with them once patient has come in for follow up.

## 2021-02-26 ENCOUNTER — Telehealth: Payer: Self-pay | Admitting: *Deleted

## 2021-02-26 NOTE — Telephone Encounter (Signed)
Per Staff message Tiffany - called and gave upcoming appointment for surgical clearance - patient was reluctant to schedule because he feels that he doesn't need it. He may call back to cancel.

## 2021-03-03 ENCOUNTER — Other Ambulatory Visit: Payer: Self-pay | Admitting: Family

## 2021-03-03 DIAGNOSIS — D6801 Von willebrand disease, type 1: Secondary | ICD-10-CM

## 2021-03-04 ENCOUNTER — Encounter: Payer: Self-pay | Admitting: Family

## 2021-03-04 ENCOUNTER — Inpatient Hospital Stay (HOSPITAL_BASED_OUTPATIENT_CLINIC_OR_DEPARTMENT_OTHER): Payer: BC Managed Care – PPO | Admitting: Family

## 2021-03-04 ENCOUNTER — Other Ambulatory Visit: Payer: Self-pay

## 2021-03-04 ENCOUNTER — Inpatient Hospital Stay: Payer: BC Managed Care – PPO | Attending: Hematology & Oncology

## 2021-03-04 VITALS — BP 135/99 | HR 64 | Temp 98.1°F | Resp 17 | Wt 201.0 lb

## 2021-03-04 DIAGNOSIS — D6801 Von willebrand disease, type 1: Secondary | ICD-10-CM | POA: Insufficient documentation

## 2021-03-04 DIAGNOSIS — D6862 Lupus anticoagulant syndrome: Secondary | ICD-10-CM | POA: Diagnosis not present

## 2021-03-04 LAB — APTT: aPTT: 34 seconds (ref 24–36)

## 2021-03-04 LAB — CBC WITH DIFFERENTIAL (CANCER CENTER ONLY)
Abs Immature Granulocytes: 0.02 10*3/uL (ref 0.00–0.07)
Basophils Absolute: 0 10*3/uL (ref 0.0–0.1)
Basophils Relative: 0 %
Eosinophils Absolute: 0.2 10*3/uL (ref 0.0–0.5)
Eosinophils Relative: 3 %
HCT: 43.8 % (ref 39.0–52.0)
Hemoglobin: 15.3 g/dL (ref 13.0–17.0)
Immature Granulocytes: 0 %
Lymphocytes Relative: 25 %
Lymphs Abs: 1.2 10*3/uL (ref 0.7–4.0)
MCH: 31 pg (ref 26.0–34.0)
MCHC: 34.9 g/dL (ref 30.0–36.0)
MCV: 88.8 fL (ref 80.0–100.0)
Monocytes Absolute: 0.3 10*3/uL (ref 0.1–1.0)
Monocytes Relative: 7 %
Neutro Abs: 2.9 10*3/uL (ref 1.7–7.7)
Neutrophils Relative %: 65 %
Platelet Count: 197 10*3/uL (ref 150–400)
RBC: 4.93 MIL/uL (ref 4.22–5.81)
RDW: 11.7 % (ref 11.5–15.5)
WBC Count: 4.5 10*3/uL (ref 4.0–10.5)
nRBC: 0 % (ref 0.0–0.2)

## 2021-03-04 LAB — CMP (CANCER CENTER ONLY)
ALT: 21 U/L (ref 0–44)
AST: 18 U/L (ref 15–41)
Albumin: 5 g/dL (ref 3.5–5.0)
Alkaline Phosphatase: 95 U/L (ref 38–126)
Anion gap: 6 (ref 5–15)
BUN: 14 mg/dL (ref 6–20)
CO2: 29 mmol/L (ref 22–32)
Calcium: 10.5 mg/dL — ABNORMAL HIGH (ref 8.9–10.3)
Chloride: 102 mmol/L (ref 98–111)
Creatinine: 0.84 mg/dL (ref 0.61–1.24)
GFR, Estimated: >60 mL/min (ref >60)
Glucose, Bld: 95 mg/dL (ref 70–99)
Potassium: 4.4 mmol/L (ref 3.5–5.1)
Sodium: 137 mmol/L (ref 135–145)
Total Bilirubin: 0.5 mg/dL (ref 0.3–1.2)
Total Protein: 8.1 g/dL (ref 6.5–8.1)

## 2021-03-04 LAB — PROTIME-INR
INR: 1 (ref 0.8–1.2)
Prothrombin Time: 13.6 seconds (ref 11.4–15.2)

## 2021-03-04 NOTE — Progress Notes (Signed)
Hematology and Oncology Follow Up Visit  NIKODEM LEADBETTER [id number] 1985-07-18 35 y.o. 03/04/2021   Principle Diagnosis:  Type 1 Von Willebrand disease   Current Therapy:        Observation   Interim History:  Mr. Pascal is here today for follow-up prior to having his teeth extracted.  He is doing well and has had no issue with bleeding. No bruising or petechiae.  No issue with infection. No fever, chills, n/v, cough, rash, dizziness, SOB, chest pain, palpitations, abdominal pain or changes in bowel or bladder habits.  No swelling, tenderness, numbness or tingling in his extremities.  No falls or syncope.  He has maintained a good appetite and is staying well hydrated. His weight is stable at 201 lbs.   ECOG Performance Status: 1 - Symptomatic but completely ambulatory  Medications:  Allergies as of 03/04/2021   No Known Allergies      Medication List        Accurate as of March 04, 2021 10:14 AM. If you have any questions, ask your nurse or doctor.          carvedilol 25 MG tablet Commonly known as: COREG Take 1 tablet (25 mg total) by mouth 2 (two) times daily with a meal.   cetirizine 10 MG tablet Commonly known as: ZYRTEC Take 10 mg by mouth daily.   MULTIVITAMIN PO Take 1 tablet by mouth daily.        Allergies: No Known Allergies  Past Medical History, Surgical history, Social history, and Family History were reviewed and updated.  Review of Systems: All other 10 point review of systems is negative.   Physical Exam:  weight is 201 lb (91.2 kg). His oral temperature is 98.1 F (36.7 C). His blood pressure is 135/99 (abnormal) and his pulse is 64. His respiration is 17 and oxygen saturation is 100%.   Wt Readings from Last 3 Encounters:  03/04/21 201 lb (91.2 kg)  09/13/20 207 lb (93.9 kg)  08/28/20 211 lb (95.7 kg)    Ocular: Sclerae unicteric, pupils equal, round and reactive to light Ear-nose-throat: Oropharynx clear, dentition  fair Lymphatic: No cervical or supraclavicular adenopathy Lungs no rales or rhonchi, good excursion bilaterally Heart regular rate and rhythm, no murmur appreciated Abd soft, nontender, positive bowel sounds MSK no focal spinal tenderness, no joint edema Neuro: non-focal, well-oriented, appropriate affect Breasts: Deferred   Lab Results  Component Value Date   WBC 4.5 03/04/2021   HGB 15.3 03/04/2021   HCT 43.8 03/04/2021   MCV 88.8 03/04/2021   PLT 197 03/04/2021   No results found for: FERRITIN, IRON, TIBC, UIBC, IRONPCTSAT Lab Results  Component Value Date   RBC 4.93 03/04/2021   No results found for: KPAFRELGTCHN, LAMBDASER, KAPLAMBRATIO No results found for: IGGSERUM, IGA, IGMSERUM No results found for: Marda Stalker, SPEI   Chemistry      Component Value Date/Time   NA 139 08/28/2020 1250   NA 142 10/14/2016 0829   K 4.4 08/28/2020 1250   K 3.8 10/14/2016 0829   CL 105 08/28/2020 1250   CO2 26 08/28/2020 1250   CO2 26 10/14/2016 0829   BUN 20 08/28/2020 1250   BUN 14.2 10/14/2016 0829   CREATININE 1.06 08/28/2020 1250   CREATININE 1.0 10/14/2016 0829      Component Value Date/Time   CALCIUM 9.9 08/28/2020 1250   CALCIUM 9.9 10/14/2016 0829   ALKPHOS 81 08/28/2020 1250   ALKPHOS 104  10/14/2016 0829   AST 17 08/28/2020 1250   AST 21 10/14/2016 0829   ALT 22 08/28/2020 1250   ALT 30 10/14/2016 0829   BILITOT 0.4 08/28/2020 1250   BILITOT 0.53 10/14/2016 0829       Impression and Plan: Mr. Mwangi is a very pleasant 35 yo caucasian male with type 1 Von Willebrand disease with positive lupus anticoagulant in the past.  He needs to have his right top wisdom tooth and the tooth next to it extracted.  We will check his counts and then arrange DDAVP prior to his procedure if needed. PTT and PT/INR are within normal limits.  VWB panel and lupus anticoagulant are pending.  He follows up with [place] as needed.  He  can contact our office with any questions or concerns.   Eileen Stanford, NP 10/18/202210:14 AM

## 2021-03-07 LAB — VON WILLEBRAND PANEL
Coagulation Factor VIII: 86 % (ref 56–140)
Ristocetin Co-factor, Plasma: 45 % — ABNORMAL LOW (ref 50–200)
Von Willebrand Antigen, Plasma: 58 % (ref 50–200)

## 2021-03-07 LAB — COAG STUDIES INTERP REPORT

## 2021-03-11 ENCOUNTER — Encounter: Payer: Self-pay | Admitting: Family

## 2021-03-11 NOTE — Telephone Encounter (Signed)
Called and informed patient of lab results, patient verbalized understanding and denies any questions or concerns at this time. Pt states he will call us when he gets the apt scheduled with his oral surgeon. He is going to try and schedule 2 weeks out to give the pharmacy plenty of time to get the medication.

## 2021-03-11 NOTE — Telephone Encounter (Signed)
-----   Message from Erenest Blank, NP sent at 03/07/2021  1:25 PM EDT ----- This fellow will need one dose of DDAVP the same day prior to his tooth extraction. He can let us know a week or so in advance so we can get him scheduled and make sure the medication is here to give. Scheduling please let us know when he schedules so pharmacy can get the medication. Thank you!  Sarah  ----- Message ----- From: Leory Plowman, Lab In [place] Sent: 03/04/2021  10:11 AM EDT To: Erenest Blank, NP

## 2021-03-12 ENCOUNTER — Telehealth: Payer: Self-pay | Admitting: *Deleted

## 2021-03-12 NOTE — Telephone Encounter (Signed)
Called and lvm for call back to get information on dental procedure to schedule treatment prior.

## 2021-03-26 ENCOUNTER — Telehealth: Payer: Self-pay

## 2021-03-26 NOTE — Telephone Encounter (Signed)
Jessica from [facility] oral sx called asking if pateint has come back this week for re draw on labs. They are needing redraw prior to the surgery. Informed her it appears he hasnt had labs drawn recently and scheduling attempted to call with no answer. She states they have not been able to get in touch as well and will have pt call if she does

## 2021-04-22 ENCOUNTER — Encounter: Payer: BC Managed Care – PPO | Admitting: Internal Medicine

## 2021-04-22 ENCOUNTER — Encounter: Payer: Self-pay | Admitting: Internal Medicine

## 2021-04-26 IMAGING — DX DG CHEST 2V
2 series · 2 of 2 positions shown · non-contrast
Comparison: Radiograph 06/24/2012

CLINICAL DATA: Cough, congestion, hypertension

EXAM:
CHEST - 2 VIEW

[chest pa]
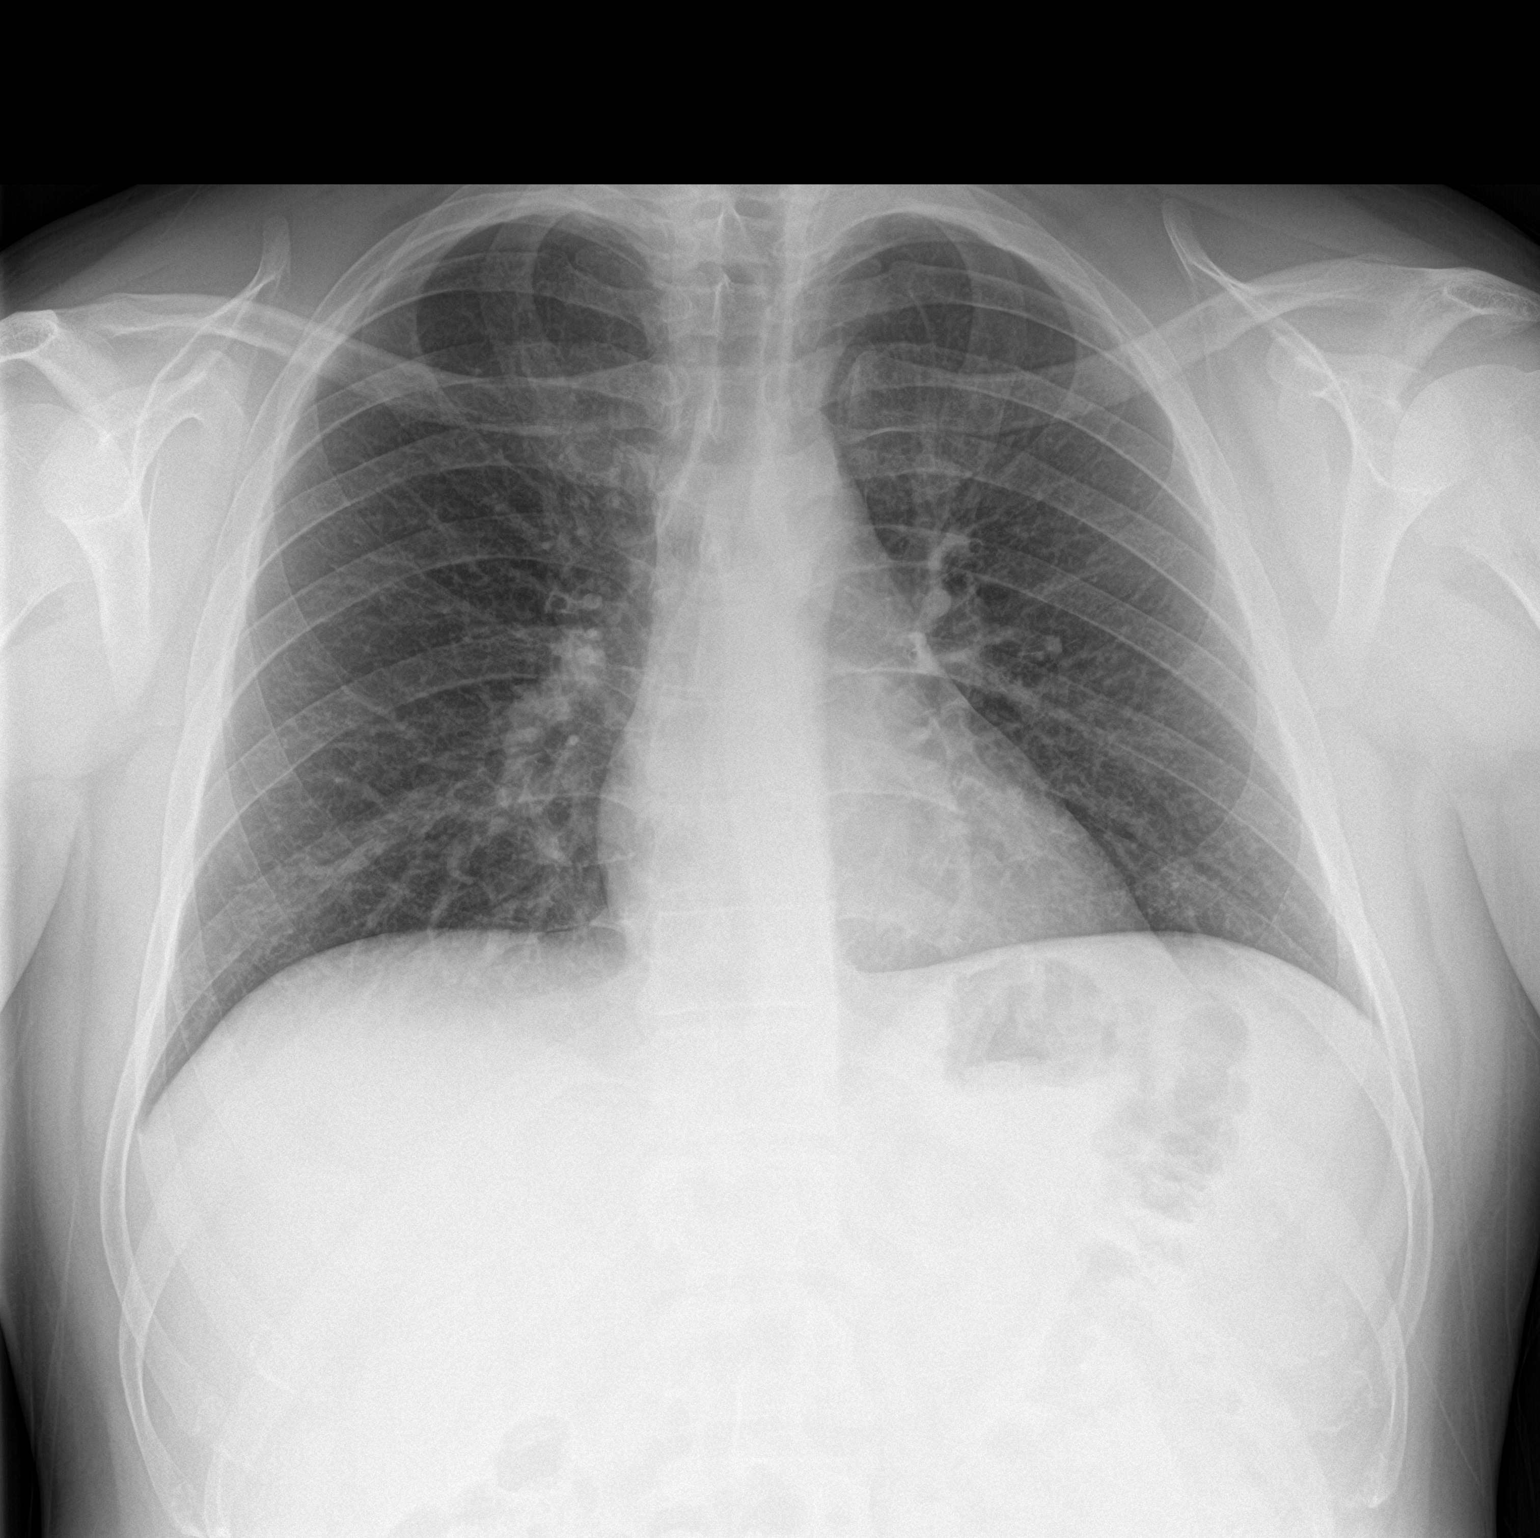

[chest lat]
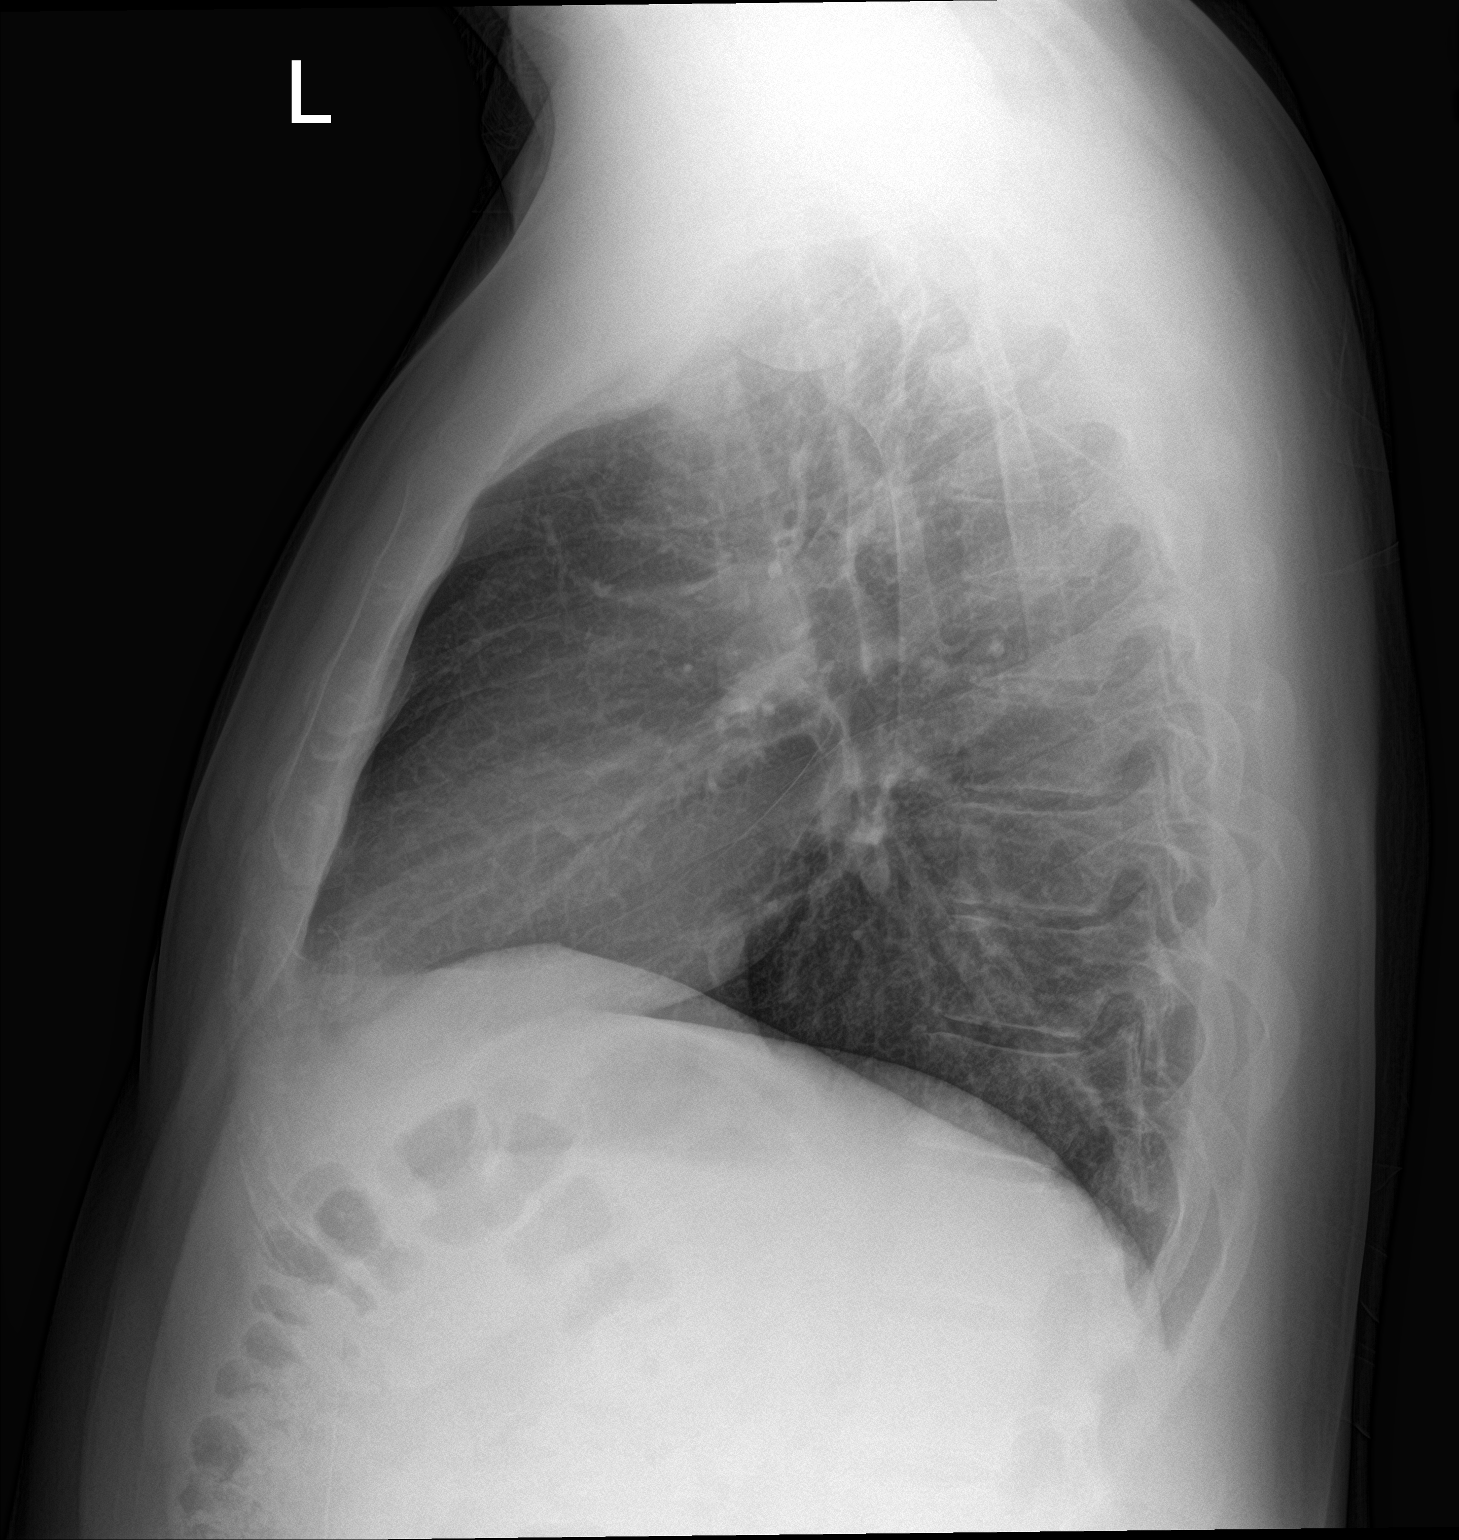

[2 of 2 positions shown; findings below may reference images not displayed]

FINDINGS: Mild airways thickening. No consolidation, features of edema,
pneumothorax, or effusion. Pulmonary vascularity is normally
distributed. The cardiomediastinal contours are unremarkable. No
acute osseous or soft tissue abnormality.
IMPRESSION: Mild airways thickening could reflect some bronchitic change or
reactive airways disease.

## 2021-08-19 ENCOUNTER — Encounter: Payer: BC Managed Care – PPO | Admitting: Internal Medicine

## 2021-08-19 ENCOUNTER — Encounter: Payer: Self-pay | Admitting: Internal Medicine

## 2021-09-26 ENCOUNTER — Other Ambulatory Visit: Payer: Self-pay | Admitting: Internal Medicine

## 2021-10-01 ENCOUNTER — Telehealth: Payer: Self-pay

## 2021-10-01 ENCOUNTER — Encounter: Payer: BC Managed Care – PPO | Admitting: Internal Medicine

## 2021-10-01 ENCOUNTER — Encounter: Payer: Self-pay | Admitting: Internal Medicine

## 2021-10-01 NOTE — Telephone Encounter (Signed)
Lets give him 1 more chance. ?Send him a letter and a  no-show fee, if he does not show we will dismiss ?

## 2021-10-01 NOTE — Telephone Encounter (Signed)
Letter mailed

## 2021-10-01 NOTE — Telephone Encounter (Signed)
3 frequent no shows:  ? ?04/22/21 ?08/19/21 ?10/01/21 ? ?Would you like to begin dismissal process?  ?

## 2021-11-12 ENCOUNTER — Encounter: Payer: Self-pay | Admitting: Internal Medicine

## 2021-11-14 ENCOUNTER — Ambulatory Visit (INDEPENDENT_AMBULATORY_CARE_PROVIDER_SITE_OTHER): Payer: BC Managed Care – PPO | Admitting: Internal Medicine

## 2021-11-14 ENCOUNTER — Encounter: Payer: Self-pay | Admitting: Internal Medicine

## 2021-11-14 VITALS — BP 126/82 | HR 56 | Temp 98.1°F | Resp 16 | Ht 74.0 in | Wt 206.1 lb

## 2021-11-14 DIAGNOSIS — Z23 Encounter for immunization: Secondary | ICD-10-CM

## 2021-11-14 DIAGNOSIS — D6801 Von willebrand disease, type 1: Secondary | ICD-10-CM | POA: Diagnosis not present

## 2021-11-14 DIAGNOSIS — Z0000 Encounter for general adult medical examination without abnormal findings: Secondary | ICD-10-CM

## 2021-11-14 DIAGNOSIS — I10 Essential (primary) hypertension: Secondary | ICD-10-CM | POA: Diagnosis not present

## 2021-11-14 LAB — CBC WITH DIFFERENTIAL/PLATELET
Basophils Absolute: 0 10*3/uL (ref 0.0–0.1)
Basophils Relative: 0.7 % (ref 0.0–3.0)
Eosinophils Absolute: 0.1 10*3/uL (ref 0.0–0.7)
Eosinophils Relative: 2.9 % (ref 0.0–5.0)
HCT: 42.9 % (ref 39.0–52.0)
Hemoglobin: 14.8 g/dL (ref 13.0–17.0)
Lymphocytes Relative: 27.6 % (ref 12.0–46.0)
Lymphs Abs: 1.2 10*3/uL (ref 0.7–4.0)
MCHC: 34.5 g/dL (ref 30.0–36.0)
MCV: 90.5 fl (ref 78.0–100.0)
Monocytes Absolute: 0.3 10*3/uL (ref 0.1–1.0)
Monocytes Relative: 6.9 % (ref 3.0–12.0)
Neutro Abs: 2.6 10*3/uL (ref 1.4–7.7)
Neutrophils Relative %: 61.9 % (ref 43.0–77.0)
Platelets: 186 10*3/uL (ref 150.0–400.0)
RBC: 4.74 Mil/uL (ref 4.22–5.81)
RDW: 12.3 % (ref 11.5–15.5)
WBC: 4.2 10*3/uL (ref 4.0–10.5)

## 2021-11-14 LAB — LIPID PANEL
Cholesterol: 217 mg/dL — ABNORMAL HIGH (ref 0–200)
HDL: 49.2 mg/dL (ref >39.00)
LDL Cholesterol: 131 mg/dL — ABNORMAL HIGH (ref 0–99)
NonHDL: 168.25
Total CHOL/HDL Ratio: 4
Triglycerides: 187 mg/dL — ABNORMAL HIGH (ref 0.0–149.0)
VLDL: 37.4 mg/dL (ref 0.0–40.0)

## 2021-11-14 LAB — TSH: TSH: 1.08 u[IU]/mL (ref 0.35–5.50)

## 2021-11-14 LAB — BASIC METABOLIC PANEL
BUN: 18 mg/dL (ref 6–23)
CO2: 27 mEq/L (ref 19–32)
Calcium: 9.6 mg/dL (ref 8.4–10.5)
Chloride: 103 mEq/L (ref 96–112)
Creatinine, Ser: 0.94 mg/dL (ref 0.40–1.50)
GFR: 103.32 mL/min (ref >60.00)
Glucose, Bld: 97 mg/dL (ref 70–99)
Potassium: 4.4 mEq/L (ref 3.5–5.1)
Sodium: 140 mEq/L (ref 135–145)

## 2021-11-14 MED ORDER — CARVEDILOL 25 MG PO TABS: ORAL_TABLET | ORAL | 3 refills | Status: AC

## 2021-11-14 NOTE — Assessment & Plan Note (Signed)
Here for CPX HTN: On carvedilol, good compliance, BP today is great, no ambulatory BPs, recommend to check from time to time.  Labs. Type I von Willebrand disease: Last visit with hematology 02-2021, was rec to follow-up as needed RTC 1 year

## 2021-11-14 NOTE — Progress Notes (Signed)
Subjective:    Patient ID: Dylan [name], male    DOB: [birth year], 36 y.o.   MRN: [id number]  DOS:  11/14/2021 Type of visit - description: cpx  Since the last office visit is doing well. Has essentially no concerns.  Denies any unusual bleeding  BP Readings from Last 3 Encounters:  11/14/21 126/82  03/04/21 (!) 135/99  09/19/20 (!) 143/97    Review of Systems  A 14 point review of systems is negative    Past Medical History:  Diagnosis Date   Anxiety    Hypertension    OSA (obstructive sleep apnea)    no cpap, couldn't tolerate   Syncope    Type 1 von Willebrand disease (HCC)     Past Surgical History:  Procedure Laterality Date   SKIN BIOPSY  2004   VASECTOMY  01/2018   Social History   Socioeconomic History   Marital status: Married    Spouse name: Not on file   Number of children: 2   Years of education: Not on file   Highest education level: Not on file  Occupational History   Occupation: [employer]  Tobacco Use   Smoking status: Never   Smokeless tobacco: Never  Substance and Sexual Activity   Alcohol use: Yes    Comment: rarely    Drug use: No   Sexual activity: Yes  Other Topics Concern   Not on file  Social History Narrative   Married , 2 kids ~  2009, 2013   Social Determinants of Health   Financial Resource Strain: Not on file  Food Insecurity: Not on file  Transportation Needs: Not on file  Physical Activity: Not on file  Stress: Not on file  Social Connections: Not on file  Intimate Partner Violence: Not on file     Current Outpatient Medications  Medication Instructions   carvedilol (COREG) 25 MG tablet TAKE ONE TABLET BY MOUTH TWICE A DAY WITH A MEAL   cetirizine (ZYRTEC) 10 mg, Oral, Daily   Multiple Vitamins-Minerals (MULTIVITAMIN PO) 1 tablet, Oral, Daily       Objective:   Physical Exam BP 126/82   Pulse (!) 56   Temp 98.1 F (36.7 C) (Oral)   Resp 16   Ht 6\' 2"  (1.88 m)   Wt 206 lb 2 oz (93.5 kg)   SpO2 96%    BMI 26.46 kg/m  General: Well developed, NAD, BMI noted Neck: No  thyromegaly  HEENT:  Normocephalic . Face symmetric, atraumatic Lungs:  CTA B Normal respiratory effort, no intercostal retractions, no accessory muscle use. Heart: RRR,  no murmur.  Abdomen:  Not distended, soft, non-tender. No rebound or rigidity.   Lower extremities: no pretibial edema bilaterally  Skin: Exposed areas without rash. Not pale. Not jaundice Neurologic:  alert & oriented X3.  Speech normal, gait appropriate for age and unassisted Strength symmetric and appropriate for age.  Psych: Cognition and judgment appear intact.  Cooperative with normal attention span and concentration.  Behavior appropriate. No anxious or depressed appearing.     Assessment    Assessment HTN  OSA, CPAP intolerant H/o recurrent  Syncope, DNKA w/ cards 2013 Pathology: --Type I Von Willebrand's disease-- sees Dr 2014, avoid ASA,  if surgery needed contact hematology for possibly DDAVP rx -- (+) Lupus anticoagulant Sees derm , moles bx benign H/o anxiety  PLAN Here for CPX HTN: On carvedilol, good compliance, BP today is great, no ambulatory BPs, recommend to check from time to  time.  Labs. Type I von Willebrand disease: Last visit with hematology 02-2021, was rec to follow-up as needed RTC 1 year

## 2021-11-14 NOTE — Patient Instructions (Signed)
Check the  blood pressure regularly BP GOAL is between 110/65 and  135/85. If it is consistently higher or lower, let me know     GO TO THE LAB : Get the blood work     GO TO THE FRONT DESK, PLEASE SCHEDULE YOUR APPOINTMENTS Come back for physical exam in 1 year 

## 2021-11-14 NOTE — Assessment & Plan Note (Signed)
-   Tdap today 10/2021 -  flu shot rec yearly -Has declined COVID vaccines consistently -CCS: Never had a cscope, not indicated -Prostate cancer screening not indicated -Diet is healthy, he packs his lunch.  Exercise: Very active at work, usually 15-20,000 steps daily..   -Labs: BMP, FLP, CBC, TSH

## 2021-11-17 ENCOUNTER — Encounter: Payer: Self-pay | Admitting: Internal Medicine

## 2022-02-05 ENCOUNTER — Encounter: Payer: Self-pay | Admitting: Internal Medicine

## 2022-02-05 DIAGNOSIS — G4733 Obstructive sleep apnea (adult) (pediatric): Secondary | ICD-10-CM

## 2022-02-12 ENCOUNTER — Ambulatory Visit (INDEPENDENT_AMBULATORY_CARE_PROVIDER_SITE_OTHER): Payer: BC Managed Care – PPO | Admitting: Adult Health

## 2022-02-12 ENCOUNTER — Encounter: Payer: Self-pay | Admitting: Adult Health

## 2022-02-12 VITALS — BP 124/84 | HR 80 | Temp 98.2°F | Ht 74.0 in | Wt 204.6 lb

## 2022-02-12 DIAGNOSIS — Z23 Encounter for immunization: Secondary | ICD-10-CM | POA: Diagnosis not present

## 2022-02-12 DIAGNOSIS — G4733 Obstructive sleep apnea (adult) (pediatric): Secondary | ICD-10-CM

## 2022-02-12 NOTE — Progress Notes (Signed)
@Patient  ID: Dylan [name], male    DOB: June 20, 1985, 36 y.o.   MRN: [id number]  Chief Complaint  Patient presents with   Consult    Referring provider: Colon Branch, MD  HPI: 36 year old male seen for sleep consult February 12, 2022 to establish for sleep apnea with ongoing symptoms of snoring, daytime sleepiness, gasping for air  TEST/EVENTS :   02/12/2022 Sleep consult  Patient presents for a sleep consult today.  Kindly referred by his primary care provider Dr. Larose Kells.  Patient says he was diagnosed with sleep apnea around age 57.  Told he had very severe sleep apnea.  He was started on CPAP says he wore this for about a year but did not keep up with his supplies and so stopped wearing it.  Patient says he has ongoing episodes of snoring, daytime sleepiness and episodes where he wakes up choking and gasping for air.  Patient says he had another episode recently and it was very scary so he wants to see if he still has sleep apnea and get treated.  Patient says he typically works 5 AM to 4 PM work shift.  A lot of times has to leave about 3:30 in the morning.  Typically only gets about 5 hours of sleep each night.  He works at [employer] as a Freight forwarder.  Typically drinks 4 Mountain Dew's daily.  Says he needs the extra caffeine to help with his sleepiness.  No history of congestive heart failure or stroke.  Does not take any sedating medications.  Does not use sleep aids.  No symptoms suspicious for cataplexy or sleep paralysis.  Weight has been steady.  Current weight is at 204 pounds with a BMI at 26.  [place] is 10 out of 24.  Typically gets sleepy if he sits down to watch TV and in the afternoon hours.   Medical history is significant for hypertension, sleep apnea, allergic rhinitis, type I von Willebrand disease, anxiety  Family history significant for diabetes, coronary disease, stroke,  Social history patient is married lives at home with his wife.  Has 2 children.  He is a never  smoker.  No alcohol or drug use.  Surgical history vasectomy.  No Known Allergies  Immunization History  Administered Date(s) Administered   DTaP 12/18/1988   Hepatitis B 02/16/1995, 03/30/1995, 08/24/1995   IPV 12/18/1988   Influenza,inj,Quad PF,6+ Mos 04/21/2013, 02/10/2017, 04/04/2018, 04/17/2019, 04/17/2020, 02/12/2022   Tdap 12/03/2011, 11/14/2021    Past Medical History:  Diagnosis Date   Anxiety    Hypertension    OSA (obstructive sleep apnea)    no cpap, couldn't tolerate   Syncope    Type 1 von Willebrand disease ([place])     Tobacco History: Social History   Tobacco Use  Smoking Status Never   Passive exposure: Past  Smokeless Tobacco Never   Counseling given: Not Answered   Outpatient Medications Prior to Visit  Medication Sig Dispense Refill   carvedilol (COREG) 25 MG tablet TAKE ONE TABLET BY MOUTH TWICE A DAY WITH A MEAL 180 tablet 3   cetirizine (ZYRTEC) 10 MG tablet Take 10 mg by mouth daily.     Multiple Vitamins-Minerals (MULTIVITAMIN PO) Take 1 tablet by mouth daily.     No facility-administered medications prior to visit.     Review of Systems:   Constitutional:   No  weight loss, night sweats,  Fevers, chills, fatigue, or  lassitude.  HEENT:   No headaches,  Difficulty swallowing,  Tooth/dental problems, or  Sore throat,                No sneezing, itching, ear ache, nasal congestion, post nasal drip,   CV:  No chest pain,  Orthopnea, PND, swelling in lower extremities, anasarca, dizziness, palpitations, syncope.   GI  No heartburn, indigestion, abdominal pain, nausea, vomiting, diarrhea, change in bowel habits, loss of appetite, bloody stools.   Resp: No shortness of breath with exertion or at rest.  No excess mucus, no productive cough,  No non-productive cough,  No coughing up of blood.  No change in color of mucus.  No wheezing.  No chest wall deformity  Skin: no rash or lesions.  GU: no dysuria, change in color of urine, no urgency  or frequency.  No flank pain, no hematuria   MS:  No joint pain or swelling.  No decreased range of motion.  No back pain.    Physical Exam  BP 124/84 (BP Location: Left Arm, Patient Position: Sitting, Cuff Size: Normal)   Pulse 80   Temp 98.2 F (36.8 C) (Oral)   Ht 6\' 2"  (1.88 m)   Wt 204 lb 9.6 oz (92.8 kg)   SpO2 99%   BMI 26.27 kg/m   GEN: A/Ox3; pleasant , NAD, well nourished    HEENT:  [place]/AT,  , NOSE-clear, THROAT-clear, no lesions, no postnasal drip or exudate noted. Class 3 MP airway   NECK:  Supple w/ fair ROM; no JVD; normal carotid impulses w/o bruits; no thyromegaly or nodules palpated; no lymphadenopathy.    RESP  Clear  P & A; w/o, wheezes/ rales/ or rhonchi. no accessory muscle use, no dullness to percussion  CARD:  RRR, no m/r/g, no peripheral edema, pulses intact, no cyanosis or clubbing.  GI:   Soft & nt; nml bowel sounds; no organomegaly or masses detected.   Musco: Warm bil, no deformities or joint swelling noted.   Neuro: alert, no focal deficits noted.    Skin: Warm, no lesions or rashes    Lab Results:  CBC     BNP No results found for: "BNP"  ProBNP No results found for: "PROBNP"  Imaging: No results found.        No data to display          No results found for: "NITRICOXIDE"      Assessment & Plan:   OSA (obstructive sleep apnea) Previous diagnosis of sleep apnea, ongoing snoring, daytime sleepiness, restless sleep, gasping for air all are concerning for ongoing sleep apnea.  We will set patient up for a home sleep study. - discussed how weight can impact sleep and risk for sleep disordered breathing - discussed options to assist with weight loss: combination of diet modification, cardiovascular and strength training exercises   - had an extensive discussion regarding the adverse health consequences related to untreated sleep disordered breathing - specifically discussed the risks for hypertension, coronary artery  disease, cardiac dysrhythmias, cerebrovascular disease, and diabetes - lifestyle modification discussed   - discussed how sleep disruption can increase risk of accidents, particularly when driving - safe driving practices were discussed     Plan  Patient Instructions  Set up for home sleep study  Healthy sleep regimen  Work on healthy weight  Do not drive if sleepy  Follow up in 6 weeks to discuss sleep study results and treatment plan .         Rexene Edison, NP 02/12/2022

## 2022-02-12 NOTE — Patient Instructions (Signed)
Set up for home sleep study  Healthy sleep regimen  Work on healthy weight  Do not drive if sleepy  Follow up in 6 weeks to discuss sleep study results and treatment plan .

## 2022-02-12 NOTE — Assessment & Plan Note (Signed)
Previous diagnosis of sleep apnea, ongoing snoring, daytime sleepiness, restless sleep, gasping for air all are concerning for ongoing sleep apnea.  We will set patient up for a home sleep study. - discussed how weight can impact sleep and risk for sleep disordered breathing - discussed options to assist with weight loss: combination of diet modification, cardiovascular and strength training exercises   - had an extensive discussion regarding the adverse health consequences related to untreated sleep disordered breathing - specifically discussed the risks for hypertension, coronary artery disease, cardiac dysrhythmias, cerebrovascular disease, and diabetes - lifestyle modification discussed   - discussed how sleep disruption can increase risk of accidents, particularly when driving - safe driving practices were discussed     Plan  Patient Instructions  Set up for home sleep study  Healthy sleep regimen  Work on healthy weight  Do not drive if sleepy  Follow up in 6 weeks to discuss sleep study results and treatment plan .

## 2022-02-13 NOTE — Progress Notes (Signed)
Reviewed and agree with assessment/plan.   Brendaly Townsel, MD [facility] 02/13/2022, 8:39 AM Pager:  [phone]  

## 2022-03-25 ENCOUNTER — Telehealth: Payer: Self-pay | Admitting: Adult Health

## 2022-03-25 NOTE — Telephone Encounter (Signed)
Pt called the office in regards to a follow up that was scheduled 11/9. Pt stated that he thought he was to have a HST prior to this appt but stated that he has not had that nor has he heard anything about having HST appt set up.  I have rescheduled pt's follow up with TP due to the HST not being scheduled yet.  PCCs, please advise on this if you know when pt might be able to have HST done. Pt was seen 9/28 by TP.

## 2022-03-25 NOTE — Telephone Encounter (Signed)
Spoke to pt & made him aware we are booking hst's about 12 weeks out.  He states that is fine & we rescheduled ov follow up with TP to 12/14.  He states he was only seeing her to follow up on hst.  I told him if it gets to be close to that date & he hasn't heard back from [place] to call us back.  He states he will.  Nothing further needed.

## 2022-03-26 ENCOUNTER — Ambulatory Visit: Payer: BC Managed Care – PPO | Admitting: Adult Health

## 2022-04-07 ENCOUNTER — Ambulatory Visit: Payer: BC Managed Care – PPO | Admitting: Adult Health

## 2022-04-30 ENCOUNTER — Ambulatory Visit: Payer: BC Managed Care – PPO | Admitting: Adult Health

## 2022-05-26 ENCOUNTER — Telehealth: Payer: Self-pay | Admitting: Adult Health

## 2022-05-26 NOTE — Telephone Encounter (Signed)
Calling to cancel PU of sleep machine. Adv we will call later to resched. Please call to advise PT. Due to weather.

## 2022-05-26 NOTE — Telephone Encounter (Signed)
I have spoken to pt & rescheduled his hst.  Nothing further needed.

## 2022-05-27 DIAGNOSIS — Z6825 Body mass index (BMI) 25.0-25.9, adult: Secondary | ICD-10-CM | POA: Diagnosis not present

## 2022-05-27 DIAGNOSIS — I10 Essential (primary) hypertension: Secondary | ICD-10-CM | POA: Diagnosis not present

## 2022-05-27 DIAGNOSIS — J209 Acute bronchitis, unspecified: Secondary | ICD-10-CM | POA: Diagnosis not present

## 2022-05-27 DIAGNOSIS — J018 Other acute sinusitis: Secondary | ICD-10-CM | POA: Diagnosis not present

## 2022-05-29 ENCOUNTER — Ambulatory Visit: Payer: BC Managed Care – PPO

## 2022-05-29 DIAGNOSIS — G4733 Obstructive sleep apnea (adult) (pediatric): Secondary | ICD-10-CM

## 2022-06-09 ENCOUNTER — Encounter: Payer: Self-pay | Admitting: Internal Medicine

## 2022-06-09 DIAGNOSIS — J069 Acute upper respiratory infection, unspecified: Secondary | ICD-10-CM | POA: Diagnosis not present

## 2022-06-09 DIAGNOSIS — R051 Acute cough: Secondary | ICD-10-CM | POA: Diagnosis not present

## 2022-06-17 DIAGNOSIS — G4733 Obstructive sleep apnea (adult) (pediatric): Secondary | ICD-10-CM | POA: Diagnosis not present

## 2022-07-01 ENCOUNTER — Encounter: Payer: Self-pay | Admitting: Adult Health

## 2022-07-01 ENCOUNTER — Ambulatory Visit: Payer: BC Managed Care – PPO | Admitting: Adult Health

## 2022-07-01 VITALS — BP 110/70 | HR 98 | Temp 98.0°F | Ht 72.0 in | Wt 203.4 lb

## 2022-07-01 DIAGNOSIS — G4733 Obstructive sleep apnea (adult) (pediatric): Secondary | ICD-10-CM

## 2022-07-01 NOTE — Patient Instructions (Signed)
Begin CPAP at bedtime wear all night long for at least 6 or more hours Try the DreamWear nasal mask Healthy sleep regimen  Work on healthy weight  Do not drive if sleepy  Saline nasal spray twice daily as needed Saline nasal gel at bedtime as needed Follow-up in 3 months and as needed

## 2022-07-01 NOTE — Progress Notes (Unsigned)
$@PatientR$  ID: Dylan [name], male    DOB: 10/19/1985, 37 y.o.   MRN: [id number]  Chief Complaint  Patient presents with   Follow-up    Referring provider: Colon Branch, MD  HPI: 37 year old male seen for sleep consult February 12, 2022 to establish for sleep apnea  TEST/EVENTS :   07/01/2022 Follow up : OSA    No Known Allergies  Immunization History  Administered Date(s) Administered   DTaP 12/18/1988   Hepatitis B 02/16/1995, 03/30/1995, 08/24/1995   IPV 12/18/1988   Influenza,inj,Quad PF,6+ Mos 04/21/2013, 02/10/2017, 04/04/2018, 04/17/2019, 04/17/2020, 02/12/2022   Tdap 12/03/2011, 11/14/2021    Past Medical History:  Diagnosis Date   Anxiety    Hypertension    OSA (obstructive sleep apnea)    no cpap, couldn't tolerate   Syncope    Type 1 von Willebrand disease ([place])     Tobacco History: Social History   Tobacco Use  Smoking Status Never   Passive exposure: Past  Smokeless Tobacco Never   Counseling given: Not Answered   Outpatient Medications Prior to Visit  Medication Sig Dispense Refill   carvedilol (COREG) 25 MG tablet TAKE ONE TABLET BY MOUTH TWICE A DAY WITH A MEAL 180 tablet 3   cetirizine (ZYRTEC) 10 MG tablet Take 10 mg by mouth daily.     Multiple Vitamins-Minerals (MULTIVITAMIN PO) Take 1 tablet by mouth daily.     No facility-administered medications prior to visit.     Review of Systems:   Constitutional:   No  weight loss, night sweats,  Fevers, chills, fatigue, or  lassitude.  HEENT:   No headaches,  Difficulty swallowing,  Tooth/dental problems, or  Sore throat,                No sneezing, itching, ear ache, nasal congestion, post nasal drip,   CV:  No chest pain,  Orthopnea, PND, swelling in lower extremities, anasarca, dizziness, palpitations, syncope.   GI  No heartburn, indigestion, abdominal pain, nausea, vomiting, diarrhea, change in bowel habits, loss of appetite, bloody stools.   Resp: No shortness of breath with  exertion or at rest.  No excess mucus, no productive cough,  No non-productive cough,  No coughing up of blood.  No change in color of mucus.  No wheezing.  No chest wall deformity  Skin: no rash or lesions.  GU: no dysuria, change in color of urine, no urgency or frequency.  No flank pain, no hematuria   MS:  No joint pain or swelling.  No decreased range of motion.  No back pain.    Physical Exam  BP 110/70 (BP Location: Left Arm, Patient Position: Sitting, Cuff Size: Normal)   Pulse 98   Temp 98 F (36.7 C) (Oral)   Ht 6' (1.829 m)   Wt 203 lb 6.4 oz (92.3 kg)   SpO2 98%   BMI 27.59 kg/m   GEN: A/Ox3; pleasant , NAD, well nourished    HEENT:  [place]/AT,  EACs-clear, TMs-wnl, NOSE-clear, THROAT-clear, no lesions, no postnasal drip or exudate noted.   NECK:  Supple w/ fair ROM; no JVD; normal carotid impulses w/o bruits; no thyromegaly or nodules palpated; no lymphadenopathy.    RESP  Clear  P & A; w/o, wheezes/ rales/ or rhonchi. no accessory muscle use, no dullness to percussion  CARD:  RRR, no m/r/g, no peripheral edema, pulses intact, no cyanosis or clubbing.  GI:   Soft & nt; nml bowel sounds; no organomegaly or masses  detected.   Musco: Warm bil, no deformities or joint swelling noted.   Neuro: alert, no focal deficits noted.    Skin: Warm, no lesions or rashes    Lab Results:  CBC    Component Value Date/Time   WBC 4.2 11/14/2021 0848   RBC 4.74 11/14/2021 0848   HGB 14.8 11/14/2021 0848   HGB 15.3 03/04/2021 0949   HGB 16.2 10/14/2016 0829   HCT 42.9 11/14/2021 0848   HCT 44.9 10/14/2016 0829   PLT 186.0 11/14/2021 0848   PLT 197 03/04/2021 0949   PLT 180 10/14/2016 0829   MCV 90.5 11/14/2021 0848   MCV 88 10/14/2016 0829   MCH 31.0 03/04/2021 0949   MCHC 34.5 11/14/2021 0848   RDW 12.3 11/14/2021 0848   RDW 12.5 10/14/2016 0829   LYMPHSABS 1.2 11/14/2021 0848   LYMPHSABS 1.2 10/14/2016 0829   MONOABS 0.3 11/14/2021 0848   EOSABS 0.1 11/14/2021  0848   EOSABS 0.1 10/14/2016 0829   BASOSABS 0.0 11/14/2021 0848   BASOSABS 0.0 10/14/2016 0829    BMET    Component Value Date/Time   NA 140 11/14/2021 0848   NA 142 10/14/2016 0829   K 4.4 11/14/2021 0848   K 3.8 10/14/2016 0829   CL 103 11/14/2021 0848   CO2 27 11/14/2021 0848   CO2 26 10/14/2016 0829   GLUCOSE 97 11/14/2021 0848   GLUCOSE 107 10/14/2016 0829   BUN 18 11/14/2021 0848   BUN 14.2 10/14/2016 0829   CREATININE 0.94 11/14/2021 0848   CREATININE 0.84 03/04/2021 0949   CREATININE 1.0 10/14/2016 0829   CALCIUM 9.6 11/14/2021 0848   CALCIUM 9.9 10/14/2016 0829   GFRNONAA >60 03/04/2021 0949   GFRAA  06/19/2010 1500    >60        The eGFR has been calculated using the MDRD equation. This calculation has not been validated in all clinical situations. eGFR's persistently <60 mL/min signify possible Chronic Kidney Disease.    BNP No results found for: "BNP"  ProBNP No results found for: "PROBNP"  Imaging: No results found.        No data to display          No results found for: "NITRICOXIDE"      Assessment & Plan:   No problem-specific Assessment & Plan notes found for this encounter.     Rexene Edison, NP 07/01/2022

## 2022-07-02 NOTE — Assessment & Plan Note (Addendum)
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  Patient will proceed with CPAP therapy.  Patient education was given.  Will begin CPAP AutoSet 5 to 15 cm H2O.  Patient says he absolutely cannot use a fullface mask.  Will try the DreamWear nasal mask. - discussed how weight can impact sleep and risk for sleep disordered breathing - discussed options to assist with weight loss: combination of diet modification, cardiovascular and strength training exercises   - had an extensive discussion regarding the adverse health consequences related to untreated sleep disordered breathing - specifically discussed the risks for hypertension, coronary artery disease, cardiac dysrhythmias, cerebrovascular disease, and diabetes - lifestyle modification discussed   - discussed how sleep disruption can increase risk of accidents, particularly when driving - safe driving practices were discussed   Plan  Patient Instructions  Begin CPAP at bedtime wear all night long for at least 6 or more hours Try the DreamWear nasal mask Healthy sleep regimen  Work on healthy weight  Do not drive if sleepy  Saline nasal spray twice daily as needed Saline nasal gel at bedtime as needed Follow-up in 3 months and as needed

## 2022-07-03 NOTE — Progress Notes (Signed)
Reviewed and agree with assessment/plan.   Chesley Mires, MD [facility]Critical [facility] 07/03/2022, 10:38 AM Pager:  [phone]

## 2022-09-29 ENCOUNTER — Telehealth: Payer: Self-pay | Admitting: *Deleted

## 2022-09-29 NOTE — Telephone Encounter (Signed)
ATC Brad with [employer], LVM requesting access to patient's airview account to get download.  If he has an older machine requested download be e-mailed to me.  Will await DL.

## 2022-09-29 NOTE — Telephone Encounter (Signed)
Left VM for patient to bring SD card if he has one in his CPAP machine.

## 2022-09-30 ENCOUNTER — Encounter: Payer: Self-pay | Admitting: *Deleted

## 2022-09-30 ENCOUNTER — Ambulatory Visit: Payer: BC Managed Care – PPO | Admitting: Adult Health

## 2022-10-08 ENCOUNTER — Encounter: Payer: Self-pay | Admitting: Adult Health

## 2022-11-16 ENCOUNTER — Encounter: Payer: BC Managed Care – PPO | Admitting: Internal Medicine

## 2023-05-26 DIAGNOSIS — J189 Pneumonia, unspecified organism: Secondary | ICD-10-CM | POA: Diagnosis not present

## 2023-06-03 DIAGNOSIS — J0140 Acute pansinusitis, unspecified: Secondary | ICD-10-CM | POA: Diagnosis not present

## 2023-06-03 DIAGNOSIS — Z6826 Body mass index (BMI) 26.0-26.9, adult: Secondary | ICD-10-CM | POA: Diagnosis not present

## 2023-11-23 DIAGNOSIS — D6801 Von willebrand disease, type 1: Secondary | ICD-10-CM | POA: Diagnosis not present

## 2023-11-23 DIAGNOSIS — Z1331 Encounter for screening for depression: Secondary | ICD-10-CM | POA: Diagnosis not present

## 2023-11-23 DIAGNOSIS — E782 Mixed hyperlipidemia: Secondary | ICD-10-CM | POA: Diagnosis not present

## 2023-11-23 DIAGNOSIS — Z0000 Encounter for general adult medical examination without abnormal findings: Secondary | ICD-10-CM | POA: Diagnosis not present

## 2023-11-23 DIAGNOSIS — G4733 Obstructive sleep apnea (adult) (pediatric): Secondary | ICD-10-CM | POA: Diagnosis not present

## 2023-11-23 DIAGNOSIS — D6861 Antiphospholipid syndrome: Secondary | ICD-10-CM | POA: Diagnosis not present

## 2023-12-15 ENCOUNTER — Encounter: Payer: Self-pay | Admitting: Internal Medicine

## 2023-12-22 DIAGNOSIS — D6861 Antiphospholipid syndrome: Secondary | ICD-10-CM | POA: Diagnosis not present

## 2023-12-22 DIAGNOSIS — I10 Essential (primary) hypertension: Secondary | ICD-10-CM | POA: Diagnosis not present

## 2023-12-22 DIAGNOSIS — D6801 Von willebrand disease, type 1: Secondary | ICD-10-CM | POA: Diagnosis not present

## 2023-12-22 DIAGNOSIS — G4733 Obstructive sleep apnea (adult) (pediatric): Secondary | ICD-10-CM | POA: Diagnosis not present

## 2024-01-12 DIAGNOSIS — D6801 Von willebrand disease, type 1: Secondary | ICD-10-CM | POA: Diagnosis not present

## 2024-01-12 DIAGNOSIS — D6861 Antiphospholipid syndrome: Secondary | ICD-10-CM | POA: Diagnosis not present

## 2024-01-12 DIAGNOSIS — T148XXA Other injury of unspecified body region, initial encounter: Secondary | ICD-10-CM | POA: Diagnosis not present

## 2024-01-19 DIAGNOSIS — T148XXA Other injury of unspecified body region, initial encounter: Secondary | ICD-10-CM | POA: Diagnosis not present

## 2024-02-16 DIAGNOSIS — R791 Abnormal coagulation profile: Secondary | ICD-10-CM | POA: Diagnosis not present

## 2024-03-17 DIAGNOSIS — R059 Cough, unspecified: Secondary | ICD-10-CM | POA: Diagnosis not present

## 2024-03-17 DIAGNOSIS — B9689 Other specified bacterial agents as the cause of diseases classified elsewhere: Secondary | ICD-10-CM | POA: Diagnosis not present

## 2024-03-17 DIAGNOSIS — J019 Acute sinusitis, unspecified: Secondary | ICD-10-CM | POA: Diagnosis not present

## 2024-03-17 DIAGNOSIS — I10 Essential (primary) hypertension: Secondary | ICD-10-CM | POA: Diagnosis not present
